# Patient Record
Sex: Female | Born: 1960
Health system: Southern US, Community
[De-identification: ages and names within clinical notes are randomized; demographics above are authoritative.]

## PROBLEM LIST (undated history)

## (undated) DIAGNOSIS — L409 Psoriasis, unspecified: Secondary | ICD-10-CM

## (undated) DIAGNOSIS — S52021A Displaced fracture of olecranon process without intraarticular extension of right ulna, initial encounter for closed fracture: Secondary | ICD-10-CM

## (undated) DIAGNOSIS — C801 Malignant (primary) neoplasm, unspecified: Secondary | ICD-10-CM

## (undated) DIAGNOSIS — T7840XA Allergy, unspecified, initial encounter: Secondary | ICD-10-CM

## (undated) HISTORY — DX: Allergy, unspecified, initial encounter: T78.40XA

## (undated) HISTORY — PX: POLYPECTOMY: SHX149

## (undated) HISTORY — DX: Malignant (primary) neoplasm, unspecified: C80.1

---

## 1999-07-16 ENCOUNTER — Other Ambulatory Visit: Admission: RE | Admit: 1999-07-16 | Discharge: 1999-07-16 | Payer: Self-pay | Admitting: Obstetrics and Gynecology

## 1999-10-25 ENCOUNTER — Encounter: Admission: RE | Admit: 1999-10-25 | Discharge: 1999-10-25 | Payer: Self-pay | Admitting: Obstetrics and Gynecology

## 1999-10-25 ENCOUNTER — Encounter: Payer: Self-pay | Admitting: Obstetrics and Gynecology

## 2000-09-09 ENCOUNTER — Other Ambulatory Visit: Admission: RE | Admit: 2000-09-09 | Discharge: 2000-09-09 | Payer: Self-pay | Admitting: Obstetrics and Gynecology

## 2001-01-01 ENCOUNTER — Encounter: Payer: Self-pay | Admitting: Obstetrics and Gynecology

## 2001-01-01 ENCOUNTER — Encounter: Admission: RE | Admit: 2001-01-01 | Discharge: 2001-01-01 | Payer: Self-pay | Admitting: Obstetrics and Gynecology

## 2001-09-29 ENCOUNTER — Other Ambulatory Visit: Admission: RE | Admit: 2001-09-29 | Discharge: 2001-09-29 | Payer: Self-pay | Admitting: Obstetrics and Gynecology

## 2002-01-05 ENCOUNTER — Encounter: Payer: Self-pay | Admitting: Obstetrics and Gynecology

## 2002-01-05 ENCOUNTER — Encounter: Admission: RE | Admit: 2002-01-05 | Discharge: 2002-01-05 | Payer: Self-pay | Admitting: Obstetrics and Gynecology

## 2002-10-26 ENCOUNTER — Other Ambulatory Visit: Admission: RE | Admit: 2002-10-26 | Discharge: 2002-10-26 | Payer: Self-pay | Admitting: Obstetrics and Gynecology

## 2002-11-17 ENCOUNTER — Encounter (INDEPENDENT_AMBULATORY_CARE_PROVIDER_SITE_OTHER): Payer: Self-pay

## 2002-11-17 ENCOUNTER — Ambulatory Visit (HOSPITAL_COMMUNITY): Admission: RE | Admit: 2002-11-17 | Discharge: 2002-11-17 | Payer: Self-pay | Admitting: Obstetrics and Gynecology

## 2003-03-15 ENCOUNTER — Encounter: Payer: Self-pay | Admitting: Obstetrics and Gynecology

## 2003-03-15 ENCOUNTER — Encounter: Admission: RE | Admit: 2003-03-15 | Discharge: 2003-03-15 | Payer: Self-pay | Admitting: Obstetrics and Gynecology

## 2003-12-05 ENCOUNTER — Other Ambulatory Visit: Admission: RE | Admit: 2003-12-05 | Discharge: 2003-12-05 | Payer: Self-pay | Admitting: Obstetrics and Gynecology

## 2004-03-26 ENCOUNTER — Encounter: Admission: RE | Admit: 2004-03-26 | Discharge: 2004-03-26 | Payer: Self-pay | Admitting: Obstetrics and Gynecology

## 2005-04-17 ENCOUNTER — Encounter: Admission: RE | Admit: 2005-04-17 | Discharge: 2005-04-17 | Payer: Self-pay | Admitting: Obstetrics and Gynecology

## 2005-12-16 HISTORY — PX: DILATION AND CURETTAGE OF UTERUS: SHX78

## 2006-04-18 ENCOUNTER — Encounter: Admission: RE | Admit: 2006-04-18 | Discharge: 2006-04-18 | Payer: Self-pay | Admitting: Obstetrics and Gynecology

## 2007-04-21 ENCOUNTER — Encounter: Admission: RE | Admit: 2007-04-21 | Discharge: 2007-04-21 | Payer: Self-pay | Admitting: Obstetrics and Gynecology

## 2007-04-24 ENCOUNTER — Encounter: Admission: RE | Admit: 2007-04-24 | Discharge: 2007-04-24 | Payer: Self-pay | Admitting: Obstetrics and Gynecology

## 2008-04-25 ENCOUNTER — Encounter: Admission: RE | Admit: 2008-04-25 | Discharge: 2008-04-25 | Payer: Self-pay | Admitting: Obstetrics and Gynecology

## 2009-04-26 ENCOUNTER — Encounter: Admission: RE | Admit: 2009-04-26 | Discharge: 2009-04-26 | Payer: Self-pay | Admitting: Obstetrics and Gynecology

## 2010-04-27 ENCOUNTER — Encounter: Admission: RE | Admit: 2010-04-27 | Discharge: 2010-04-27 | Payer: Self-pay | Admitting: Obstetrics and Gynecology

## 2010-05-01 ENCOUNTER — Encounter: Admission: RE | Admit: 2010-05-01 | Discharge: 2010-05-01 | Payer: Self-pay | Admitting: Obstetrics and Gynecology

## 2011-01-06 ENCOUNTER — Encounter: Payer: Self-pay | Admitting: Obstetrics and Gynecology

## 2011-03-15 ENCOUNTER — Ambulatory Visit (AMBULATORY_SURGERY_CENTER): Payer: Commercial Managed Care - PPO | Admitting: *Deleted

## 2011-03-15 ENCOUNTER — Encounter: Payer: Self-pay | Admitting: Internal Medicine

## 2011-03-15 VITALS — Ht 66.0 in | Wt 119.8 lb

## 2011-03-15 DIAGNOSIS — Z1211 Encounter for screening for malignant neoplasm of colon: Secondary | ICD-10-CM

## 2011-03-15 MED ORDER — PEG-KCL-NACL-NASULF-NA ASC-C 100 G PO SOLR: 1.0000 | Freq: Once | ORAL | Status: AC

## 2011-03-29 ENCOUNTER — Ambulatory Visit (AMBULATORY_SURGERY_CENTER): Payer: Commercial Managed Care - PPO | Admitting: Internal Medicine

## 2011-03-29 ENCOUNTER — Encounter: Payer: Self-pay | Admitting: Internal Medicine

## 2011-03-29 VITALS — BP 112/69 | HR 59 | Temp 98.2°F | Resp 15 | Ht 66.0 in | Wt 118.0 lb

## 2011-03-29 DIAGNOSIS — R933 Abnormal findings on diagnostic imaging of other parts of digestive tract: Secondary | ICD-10-CM

## 2011-03-29 DIAGNOSIS — D126 Benign neoplasm of colon, unspecified: Secondary | ICD-10-CM

## 2011-03-29 DIAGNOSIS — Z1211 Encounter for screening for malignant neoplasm of colon: Secondary | ICD-10-CM

## 2011-03-29 MED ORDER — SODIUM CHLORIDE 0.9 % IV SOLN: 500.0000 mL | INTRAVENOUS | Status: DC

## 2011-03-29 NOTE — Patient Instructions (Signed)
Please have another colonoscopy in 5 yrs due to the probable polyp in your intestine.   If you have any questions, please call us at [phone]. Thank-you.

## 2011-04-01 ENCOUNTER — Telehealth: Payer: Self-pay | Admitting: *Deleted

## 2011-04-01 NOTE — Telephone Encounter (Signed)
Left message on identified voice mail.

## 2011-05-03 NOTE — Op Note (Signed)
NAMEIMAGENE, Tiffany Lopez                         ACCOUNT NO.:  [id number]   MEDICAL RECORD NO.:  [id number]                   PATIENT TYPE:  AMB   LOCATION:  SDC                                  FACILITY:  [facility]   PHYSICIAN:  Sherry A. Rosalio Macadamia, M.D.           DATE OF BIRTH:  11/24/1961   DATE OF PROCEDURE:  11/17/2002  DATE OF DISCHARGE:  11/17/2002                                 OPERATIVE REPORT   PREOPERATIVE DIAGNOSES:  1. Menorrhagia.  2. Tight hymenal band.   POSTOPERATIVE DIAGNOSES:  1. Menorrhagia.  2. Tight hymenal band.  3. Endometrial polyp.   PROCEDURES:  Dilatation and curettage, hysteroscopy with resectoscope.   SURGEON:  Sherry A. Rosalio Macadamia, M.D.   ANESTHESIA:  MAC.   COMPLICATIONS:  None.   ESTIMATED BLOOD LOSS:  Less than 5 cc.   FLUIDS:  Sorbitol differential -20 cc.   INDICATIONS:  This is a 50 year old 2, P2-0-0-2, woman who has had severe  menorrhagia.  The patient had a ultrasound performed revealing a thickened  endometrium with probable endometrial polyp present.  The patient also has  had a very tight hymenal band causing some dyspareunia.  Because of this,  the patient is brought to the operating room for [facility] hysteroscopy with  resectoscope and hymenotomy.   FINDINGS:  Normal-sized, retroverted uterus.  Two endometrial polyps.  Tight  hymenal band.   DESCRIPTION OF PROCEDURE:  The patient was brought into the operating room  and given adequate IV sedation.  She was placed in the dorsal lithotomy  position.  Her perineum was washed with Hibiclens.  The patient was draped  in a sterile fashion.  The speculum was placed within the vagina.  The  vagina was washed with Hibiclens.  Paracervical block was administered with  1% Nesacaine.  Anterior lip of the cervix was grasped with a single-tooth  tenaculum.  Cervix was dilated with Pratt dilators to a #31.  Hysteroscope  was introduced into the endometrial cavity.  Pictures were obtained.  Using  double-loupe right angle resector, endometrial polyps were resected and  circumferential resections were taken throughout the endometrial cavity.  Adequate hemostasis was present.  The speculum was removed from the vagina.  The hymenal band was then evaluated.   The hymenal band was infiltrated with 0.5% Marcaine with epinephrine.  Using  Kelly clamps, the hymenal band was doubly clamped at 5 o'clock and 7  o'clock.  This was cut and sutured with 4-0 chromic.  This was done to an  adequate depth to be able to release the tension of the hymenal band.  Adequate hemostasis was present.  The patient was then taken out of the  dorsal lithotomy position.  She was awakened.  She was moved from the  operating table to a stretcher in stable condition.  Sherry A. Rosalio Macadamia, M.D.    SAD/MEDQ  D:  12/02/2002  T:  12/02/2002  Job:  [id number]

## 2011-06-10 ENCOUNTER — Other Ambulatory Visit: Payer: Self-pay | Admitting: Obstetrics & Gynecology

## 2011-06-10 DIAGNOSIS — Z1231 Encounter for screening mammogram for malignant neoplasm of breast: Secondary | ICD-10-CM

## 2011-07-02 ENCOUNTER — Ambulatory Visit: Payer: Commercial Managed Care - PPO

## 2011-08-06 ENCOUNTER — Ambulatory Visit
Admission: RE | Admit: 2011-08-06 | Discharge: 2011-08-06 | Disposition: A | Discharge status: Home / Self Care | Payer: Commercial Managed Care - PPO | Source: Ambulatory Visit | Attending: Obstetrics & Gynecology | Admitting: Obstetrics & Gynecology

## 2011-08-06 DIAGNOSIS — Z1231 Encounter for screening mammogram for malignant neoplasm of breast: Secondary | ICD-10-CM

## 2012-10-02 ENCOUNTER — Other Ambulatory Visit: Payer: Self-pay | Admitting: Obstetrics & Gynecology

## 2012-10-02 DIAGNOSIS — Z1231 Encounter for screening mammogram for malignant neoplasm of breast: Secondary | ICD-10-CM

## 2012-11-05 ENCOUNTER — Ambulatory Visit
Admission: RE | Admit: 2012-11-05 | Discharge: 2012-11-05 | Disposition: A | Discharge status: Home / Self Care | Payer: Commercial Managed Care - PPO | Source: Ambulatory Visit | Attending: Obstetrics & Gynecology | Admitting: Obstetrics & Gynecology

## 2012-11-05 DIAGNOSIS — Z1231 Encounter for screening mammogram for malignant neoplasm of breast: Secondary | ICD-10-CM

## 2013-03-03 ENCOUNTER — Other Ambulatory Visit: Payer: Self-pay | Admitting: Obstetrics and Gynecology

## 2013-03-03 DIAGNOSIS — Z780 Asymptomatic menopausal state: Secondary | ICD-10-CM

## 2013-03-22 ENCOUNTER — Other Ambulatory Visit: Payer: Commercial Managed Care - PPO

## 2013-04-08 ENCOUNTER — Ambulatory Visit
Admission: RE | Admit: 2013-04-08 | Discharge: 2013-04-08 | Disposition: A | Discharge status: Home / Self Care | Payer: Commercial Managed Care - PPO | Source: Ambulatory Visit | Attending: Obstetrics and Gynecology | Admitting: Obstetrics and Gynecology

## 2013-04-08 DIAGNOSIS — Z780 Asymptomatic menopausal state: Secondary | ICD-10-CM

## 2013-04-08 DIAGNOSIS — M899 Disorder of bone, unspecified: Secondary | ICD-10-CM

## 2014-02-15 ENCOUNTER — Other Ambulatory Visit: Payer: Self-pay

## 2014-02-15 DIAGNOSIS — Z1231 Encounter for screening mammogram for malignant neoplasm of breast: Secondary | ICD-10-CM

## 2014-03-15 ENCOUNTER — Ambulatory Visit
Admission: RE | Admit: 2014-03-15 | Discharge: 2014-03-15 | Disposition: A | Discharge status: Home / Self Care | Payer: 59 | Source: Ambulatory Visit

## 2014-03-15 DIAGNOSIS — Z1231 Encounter for screening mammogram for malignant neoplasm of breast: Secondary | ICD-10-CM

## 2015-06-28 ENCOUNTER — Other Ambulatory Visit: Payer: Self-pay

## 2015-06-28 DIAGNOSIS — Z1231 Encounter for screening mammogram for malignant neoplasm of breast: Secondary | ICD-10-CM

## 2015-07-04 ENCOUNTER — Ambulatory Visit: Payer: 59

## 2015-07-18 ENCOUNTER — Ambulatory Visit
Admission: RE | Admit: 2015-07-18 | Discharge: 2015-07-18 | Disposition: A | Discharge status: Home / Self Care | Payer: 59 | Source: Ambulatory Visit

## 2015-07-18 DIAGNOSIS — Z1231 Encounter for screening mammogram for malignant neoplasm of breast: Secondary | ICD-10-CM

## 2015-10-07 ENCOUNTER — Other Ambulatory Visit: Payer: Self-pay | Admitting: Obstetrics and Gynecology

## 2015-10-07 DIAGNOSIS — M899 Disorder of bone, unspecified: Secondary | ICD-10-CM

## 2015-10-07 DIAGNOSIS — M8580 Other specified disorders of bone density and structure, unspecified site: Secondary | ICD-10-CM

## 2015-10-07 DIAGNOSIS — M949 Disorder of cartilage, unspecified: Secondary | ICD-10-CM

## 2015-12-17 HISTORY — PX: COLONOSCOPY: SHX174

## 2016-03-08 ENCOUNTER — Encounter: Payer: Self-pay | Admitting: Gastroenterology

## 2016-03-14 DIAGNOSIS — Z0000 Encounter for general adult medical examination without abnormal findings: Secondary | ICD-10-CM | POA: Diagnosis not present

## 2016-03-22 DIAGNOSIS — Z1389 Encounter for screening for other disorder: Secondary | ICD-10-CM | POA: Diagnosis not present

## 2016-03-22 DIAGNOSIS — Z681 Body mass index (BMI) 19 or less, adult: Secondary | ICD-10-CM | POA: Diagnosis not present

## 2016-03-22 DIAGNOSIS — K635 Polyp of colon: Secondary | ICD-10-CM | POA: Diagnosis not present

## 2016-03-22 DIAGNOSIS — M722 Plantar fascial fibromatosis: Secondary | ICD-10-CM | POA: Diagnosis not present

## 2016-03-22 DIAGNOSIS — C439 Malignant melanoma of skin, unspecified: Secondary | ICD-10-CM | POA: Diagnosis not present

## 2016-03-22 DIAGNOSIS — E7800 Pure hypercholesterolemia, unspecified: Secondary | ICD-10-CM | POA: Diagnosis not present

## 2016-03-22 DIAGNOSIS — R3129 Other microscopic hematuria: Secondary | ICD-10-CM | POA: Diagnosis not present

## 2016-03-22 DIAGNOSIS — Z0000 Encounter for general adult medical examination without abnormal findings: Secondary | ICD-10-CM | POA: Diagnosis not present

## 2016-05-24 ENCOUNTER — Encounter: Payer: Self-pay | Admitting: Gastroenterology

## 2016-06-26 ENCOUNTER — Other Ambulatory Visit: Payer: Self-pay | Admitting: Obstetrics and Gynecology

## 2016-06-26 DIAGNOSIS — Z1231 Encounter for screening mammogram for malignant neoplasm of breast: Secondary | ICD-10-CM

## 2016-07-02 DIAGNOSIS — D0461 Carcinoma in situ of skin of right upper limb, including shoulder: Secondary | ICD-10-CM | POA: Diagnosis not present

## 2016-07-02 DIAGNOSIS — D2271 Melanocytic nevi of right lower limb, including hip: Secondary | ICD-10-CM | POA: Diagnosis not present

## 2016-07-02 DIAGNOSIS — D485 Neoplasm of uncertain behavior of skin: Secondary | ICD-10-CM | POA: Diagnosis not present

## 2016-07-02 DIAGNOSIS — D1801 Hemangioma of skin and subcutaneous tissue: Secondary | ICD-10-CM | POA: Diagnosis not present

## 2016-07-02 DIAGNOSIS — Z85828 Personal history of other malignant neoplasm of skin: Secondary | ICD-10-CM | POA: Diagnosis not present

## 2016-07-02 DIAGNOSIS — L812 Freckles: Secondary | ICD-10-CM | POA: Diagnosis not present

## 2016-07-02 DIAGNOSIS — L821 Other seborrheic keratosis: Secondary | ICD-10-CM | POA: Diagnosis not present

## 2016-07-26 ENCOUNTER — Ambulatory Visit (AMBULATORY_SURGERY_CENTER): Payer: Self-pay | Admitting: *Deleted

## 2016-07-26 VITALS — Ht 66.0 in | Wt 119.0 lb

## 2016-07-26 DIAGNOSIS — Z86010 Personal history of colonic polyps: Secondary | ICD-10-CM

## 2016-07-26 MED ORDER — NA SULFATE-K SULFATE-MG SULF 17.5-3.13-1.6 GM/177ML PO SOLN: 1.0000 | Freq: Once | ORAL | 0 refills | Status: AC

## 2016-07-26 NOTE — Progress Notes (Signed)
No allergies to eggs or soy. No problems with anesthesia.  Pt given Emmi instructions for colonoscopy  No oxygen use  No diet drug use  

## 2016-08-08 ENCOUNTER — Telehealth: Payer: Self-pay | Admitting: Gastroenterology

## 2016-08-08 MED ORDER — NA SULFATE-K SULFATE-MG SULF 17.5-3.13-1.6 GM/177ML PO SOLN: 1.0000 | Freq: Once | ORAL | 0 refills | Status: AC

## 2016-08-08 NOTE — Telephone Encounter (Signed)
rx for Suprep resent to patient's pharmacy.

## 2016-08-09 ENCOUNTER — Encounter: Payer: Self-pay | Admitting: Gastroenterology

## 2016-08-09 ENCOUNTER — Ambulatory Visit (AMBULATORY_SURGERY_CENTER): Payer: 59 | Admitting: Gastroenterology

## 2016-08-09 VITALS — BP 110/72 | HR 50 | Temp 97.4°F | Resp 21 | Ht 66.0 in | Wt 119.0 lb

## 2016-08-09 DIAGNOSIS — K5520 Angiodysplasia of colon without hemorrhage: Secondary | ICD-10-CM

## 2016-08-09 DIAGNOSIS — Z86010 Personal history of colonic polyps: Secondary | ICD-10-CM | POA: Diagnosis not present

## 2016-08-09 DIAGNOSIS — Z1211 Encounter for screening for malignant neoplasm of colon: Secondary | ICD-10-CM | POA: Diagnosis not present

## 2016-08-09 DIAGNOSIS — D128 Benign neoplasm of rectum: Secondary | ICD-10-CM | POA: Diagnosis not present

## 2016-08-09 DIAGNOSIS — D129 Benign neoplasm of anus and anal canal: Secondary | ICD-10-CM

## 2016-08-09 MED ORDER — SODIUM CHLORIDE 0.9 % IV SOLN: 500.0000 mL | INTRAVENOUS | Status: DC

## 2016-08-09 NOTE — Op Note (Signed)
[facility] Patient Name: Tiffany Lopez Procedure Date: 08/09/2016 7:59 AM MRN: [id number] Endoscopist: Remo Lipps P. Havery Moros , MD Age: 55 Referring MD:  Date of Birth: [birth year] Gender: Female Account #: [id number] Procedure:                Colonoscopy Indications:              High risk colon cancer surveillance: Personal                            history of colonic polyps Medicines:                Monitored Anesthesia Care Procedure:                Pre-Anesthesia Assessment:                           - Prior to the procedure, a History and Physical                            was performed, and patient medications and                            allergies were reviewed. The patient's tolerance of                            previous anesthesia was also reviewed. The risks                            and benefits of the procedure and the sedation                            options and risks were discussed with the patient.                            All questions were answered, and informed consent                            was obtained. Prior Anticoagulants: The patient has                            taken no previous anticoagulant or antiplatelet                            agents. ASA Grade Assessment: II - A patient with                            mild systemic disease. After reviewing the risks                            and benefits, the patient was deemed in                            satisfactory condition to undergo the procedure.  After obtaining informed consent, the colonoscope                            was passed under direct vision. Throughout the                            procedure, the patient's blood pressure, pulse, and                            oxygen saturations were monitored continuously. The                            Model PCF-H190DL [id number]) scope was introduced                            through the anus and advanced  to the the cecum,                            identified by appendiceal orifice and ileocecal                            valve. The colonoscopy was performed without                            difficulty. The patient tolerated the procedure                            well. The quality of the bowel preparation was                            good. The ileocecal valve, appendiceal orifice, and                            rectum were photographed. Scope In: 8:01:54 AM Scope Out: 8:18:50 AM Scope Withdrawal Time: 0 hours 12 minutes 34 seconds  Total Procedure Duration: 0 hours 16 minutes 56 seconds  Findings:                 The perianal and digital rectal examinations were                            normal.                           A 5 mm polyp was found in the rectum. The polyp was                            sessile. The polyp was removed with a cold snare.                            Resection and retrieval were complete.                           The colon was tortuous.  A single small angiodysplastic lesion was found in                            the sigmoid colon.                           The exam was otherwise without abnormality on                            direct and retroflexion views. Complications:            No immediate complications. Estimated blood loss:                            Minimal. Estimated Blood Loss:     Estimated blood loss was minimal. Impression:               - One 5 mm polyp in the rectum, removed with a cold                            snare. Resected and retrieved.                           - Tortuous colon.                           - A single colonic angiodysplastic lesion.                           - The examination was otherwise normal on direct                            and retroflexion views. Recommendation:           - Patient has a contact number available for                            emergencies. The signs and symptoms of  potential                            delayed complications were discussed with the                            patient. Return to normal activities tomorrow.                            Written discharge instructions were provided to the                            patient.                           - Resume previous diet.                           - Continue present medications.                           -  No aspirin, ibuprofen, naproxen, or other                            non-steroidal anti-inflammatory drugs for 2 weeks                            after polyp removal.                           - Await pathology results.                           - Repeat colonoscopy is recommended for                            surveillance. The colonoscopy date will be                            determined after pathology results from today's                            exam become available for review. Remo Lipps P. Dorethy Tomey, MD 08/09/2016 8:23:09 AM This report has been signed electronically.

## 2016-08-09 NOTE — Patient Instructions (Signed)
YOU HAD AN ENDOSCOPIC PROCEDURE TODAY AT THE [facility]:   Refer to the procedure report that was given to you for any specific questions about what was found during the examination.  If the procedure report does not answer your questions, please call your gastroenterologist to clarify.  If you requested that your care partner not be given the details of your procedure findings, then the procedure report has been included in a sealed envelope for you to review at your convenience later.  YOU SHOULD EXPECT: Some feelings of bloating in the abdomen. Passage of more gas than usual.  Walking can help get rid of the air that was put into your GI tract during the procedure and reduce the bloating. If you had a lower endoscopy (such as a colonoscopy or flexible sigmoidoscopy) you may notice spotting of blood in your stool or on the toilet paper. If you underwent a bowel prep for your procedure, you may not have a normal bowel movement for a few days.  Please Note:  You might notice some irritation and congestion in your nose or some drainage.  This is from the oxygen used during your procedure.  There is no need for concern and it should clear up in a day or so.  SYMPTOMS TO REPORT IMMEDIATELY:   Following lower endoscopy (colonoscopy or flexible sigmoidoscopy):  Excessive amounts of blood in the stool  Significant tenderness or worsening of abdominal pains  Swelling of the abdomen that is new, acute  Fever of 100F or higher   For urgent or emergent issues, a gastroenterologist can be reached at any hour by calling [phone].   DIET:  We do recommend a small meal at first, but then you may proceed to your regular diet.  Drink plenty of fluids but you should avoid alcoholic beverages for 24 hours.  ACTIVITY:  You should plan to take it easy for the rest of today and you should NOT DRIVE or use heavy machinery until tomorrow (because of the sedation medicines used during the test).     FOLLOW UP: Our staff will call the number listed on your records the next business day following your procedure to check on you and address any questions or concerns that you may have regarding the information given to you following your procedure. If we do not reach you, we will leave a message.  However, if you are feeling well and you are not experiencing any problems, there is no need to return our call.  We will assume that you have returned to your regular daily activities without incident.  If any biopsies were taken you will be contacted by phone or by letter within the next 1-3 weeks.  Please call us at [phone] if you have not heard about the biopsies in 3 weeks.    SIGNATURES/CONFIDENTIALITY: You and/or your care partner have signed paperwork which will be entered into your electronic medical record.  These signatures attest to the fact that that the information above on your After Visit Summary has been reviewed and is understood.  Full responsibility of the confidentiality of this discharge information lies with you and/or your care-partner.  Read all of the handouts given to you by your recovery room nurse. Thank-you for choosing us for your healthcare needs today. 

## 2016-08-09 NOTE — Progress Notes (Signed)
Called to room to assist during endoscopic procedure.  Patient ID and intended procedure confirmed with present staff. Received instructions for my participation in the procedure from the performing physician.  

## 2016-08-09 NOTE — Progress Notes (Signed)
A and O x3. Report to RN. Tolerated MAC anesthesia well. 

## 2016-08-12 ENCOUNTER — Telehealth: Payer: Self-pay

## 2016-08-12 NOTE — Telephone Encounter (Signed)
  Follow up Call-  Call back number 08/09/2016  Post procedure Call Back phone  # [phone]  Permission to leave phone message Yes  Some recent data might be hidden    Patient was called for follow up after her procedure on 08/09/2016. No answer at the number given for follow up phone call. A message was left on the answering machine.

## 2016-08-14 ENCOUNTER — Encounter: Payer: Self-pay | Admitting: Gastroenterology

## 2016-08-20 ENCOUNTER — Ambulatory Visit
Admission: RE | Admit: 2016-08-20 | Discharge: 2016-08-20 | Disposition: A | Discharge status: Home / Self Care | Payer: 59 | Source: Ambulatory Visit | Attending: Obstetrics and Gynecology | Admitting: Obstetrics and Gynecology

## 2016-08-20 DIAGNOSIS — Z1231 Encounter for screening mammogram for malignant neoplasm of breast: Secondary | ICD-10-CM | POA: Diagnosis not present

## 2016-08-21 ENCOUNTER — Other Ambulatory Visit: Payer: Self-pay | Admitting: Obstetrics and Gynecology

## 2016-08-21 DIAGNOSIS — R928 Other abnormal and inconclusive findings on diagnostic imaging of breast: Secondary | ICD-10-CM

## 2016-08-27 ENCOUNTER — Ambulatory Visit
Admission: RE | Admit: 2016-08-27 | Discharge: 2016-08-27 | Disposition: A | Discharge status: Home / Self Care | Payer: 59 | Source: Ambulatory Visit | Attending: Obstetrics and Gynecology | Admitting: Obstetrics and Gynecology

## 2016-08-27 DIAGNOSIS — N6489 Other specified disorders of breast: Secondary | ICD-10-CM | POA: Diagnosis not present

## 2016-08-27 DIAGNOSIS — R928 Other abnormal and inconclusive findings on diagnostic imaging of breast: Secondary | ICD-10-CM

## 2016-08-27 DIAGNOSIS — R922 Inconclusive mammogram: Secondary | ICD-10-CM | POA: Diagnosis not present

## 2016-09-24 DIAGNOSIS — Z01419 Encounter for gynecological examination (general) (routine) without abnormal findings: Secondary | ICD-10-CM | POA: Diagnosis not present

## 2016-09-24 DIAGNOSIS — Z681 Body mass index (BMI) 19 or less, adult: Secondary | ICD-10-CM | POA: Diagnosis not present

## 2016-09-24 DIAGNOSIS — M899 Disorder of bone, unspecified: Secondary | ICD-10-CM | POA: Diagnosis not present

## 2016-12-24 DIAGNOSIS — D2271 Melanocytic nevi of right lower limb, including hip: Secondary | ICD-10-CM | POA: Diagnosis not present

## 2016-12-24 DIAGNOSIS — D225 Melanocytic nevi of trunk: Secondary | ICD-10-CM | POA: Diagnosis not present

## 2016-12-24 DIAGNOSIS — L821 Other seborrheic keratosis: Secondary | ICD-10-CM | POA: Diagnosis not present

## 2016-12-24 DIAGNOSIS — Z85820 Personal history of malignant melanoma of skin: Secondary | ICD-10-CM | POA: Diagnosis not present

## 2016-12-24 DIAGNOSIS — D2272 Melanocytic nevi of left lower limb, including hip: Secondary | ICD-10-CM | POA: Diagnosis not present

## 2016-12-24 DIAGNOSIS — D1801 Hemangioma of skin and subcutaneous tissue: Secondary | ICD-10-CM | POA: Diagnosis not present

## 2016-12-24 DIAGNOSIS — C44619 Basal cell carcinoma of skin of left upper limb, including shoulder: Secondary | ICD-10-CM | POA: Diagnosis not present

## 2016-12-24 DIAGNOSIS — L812 Freckles: Secondary | ICD-10-CM | POA: Diagnosis not present

## 2016-12-24 DIAGNOSIS — Z85828 Personal history of other malignant neoplasm of skin: Secondary | ICD-10-CM | POA: Diagnosis not present

## 2016-12-24 DIAGNOSIS — D485 Neoplasm of uncertain behavior of skin: Secondary | ICD-10-CM | POA: Diagnosis not present

## 2017-03-31 ENCOUNTER — Other Ambulatory Visit (HOSPITAL_COMMUNITY): Payer: Self-pay | Admitting: Adult Health

## 2017-03-31 MED ORDER — LEVOFLOXACIN 750 MG PO TABS: 750.0000 mg | ORAL_TABLET | Freq: Every day | ORAL | Status: DC

## 2017-04-02 ENCOUNTER — Ambulatory Visit
Admission: RE | Admit: 2017-04-02 | Discharge: 2017-04-02 | Disposition: A | Discharge status: Home / Self Care | Payer: 59 | Source: Ambulatory Visit | Attending: Surgery | Admitting: Surgery

## 2017-04-02 ENCOUNTER — Other Ambulatory Visit: Payer: Self-pay | Admitting: *Deleted

## 2017-04-02 DIAGNOSIS — J1000 Influenza due to other identified influenza virus with unspecified type of pneumonia: Secondary | ICD-10-CM

## 2017-04-02 DIAGNOSIS — R05 Cough: Secondary | ICD-10-CM | POA: Diagnosis not present

## 2017-04-02 DIAGNOSIS — R079 Chest pain, unspecified: Secondary | ICD-10-CM | POA: Diagnosis not present

## 2017-06-17 DIAGNOSIS — Z0000 Encounter for general adult medical examination without abnormal findings: Secondary | ICD-10-CM | POA: Diagnosis not present

## 2017-06-24 DIAGNOSIS — E7800 Pure hypercholesterolemia, unspecified: Secondary | ICD-10-CM | POA: Diagnosis not present

## 2017-06-24 DIAGNOSIS — K635 Polyp of colon: Secondary | ICD-10-CM | POA: Diagnosis not present

## 2017-06-24 DIAGNOSIS — Z681 Body mass index (BMI) 19 or less, adult: Secondary | ICD-10-CM | POA: Diagnosis not present

## 2017-06-24 DIAGNOSIS — Z1389 Encounter for screening for other disorder: Secondary | ICD-10-CM | POA: Diagnosis not present

## 2017-06-24 DIAGNOSIS — Z0000 Encounter for general adult medical examination without abnormal findings: Secondary | ICD-10-CM | POA: Diagnosis not present

## 2017-06-24 DIAGNOSIS — C439 Malignant melanoma of skin, unspecified: Secondary | ICD-10-CM | POA: Diagnosis not present

## 2017-07-01 DIAGNOSIS — D2272 Melanocytic nevi of left lower limb, including hip: Secondary | ICD-10-CM | POA: Diagnosis not present

## 2017-07-01 DIAGNOSIS — L821 Other seborrheic keratosis: Secondary | ICD-10-CM | POA: Diagnosis not present

## 2017-07-01 DIAGNOSIS — D1801 Hemangioma of skin and subcutaneous tissue: Secondary | ICD-10-CM | POA: Diagnosis not present

## 2017-07-01 DIAGNOSIS — Z85820 Personal history of malignant melanoma of skin: Secondary | ICD-10-CM | POA: Diagnosis not present

## 2017-07-01 DIAGNOSIS — D2271 Melanocytic nevi of right lower limb, including hip: Secondary | ICD-10-CM | POA: Diagnosis not present

## 2017-07-01 DIAGNOSIS — L814 Other melanin hyperpigmentation: Secondary | ICD-10-CM | POA: Diagnosis not present

## 2017-07-01 DIAGNOSIS — Z85828 Personal history of other malignant neoplasm of skin: Secondary | ICD-10-CM | POA: Diagnosis not present

## 2017-07-01 DIAGNOSIS — L812 Freckles: Secondary | ICD-10-CM | POA: Diagnosis not present

## 2017-10-22 ENCOUNTER — Other Ambulatory Visit: Payer: Self-pay | Admitting: Obstetrics and Gynecology

## 2017-10-22 DIAGNOSIS — Z1231 Encounter for screening mammogram for malignant neoplasm of breast: Secondary | ICD-10-CM

## 2017-11-27 DIAGNOSIS — Z01419 Encounter for gynecological examination (general) (routine) without abnormal findings: Secondary | ICD-10-CM | POA: Diagnosis not present

## 2017-11-27 DIAGNOSIS — Z1151 Encounter for screening for human papillomavirus (HPV): Secondary | ICD-10-CM | POA: Diagnosis not present

## 2017-11-27 DIAGNOSIS — Z6820 Body mass index (BMI) 20.0-20.9, adult: Secondary | ICD-10-CM | POA: Diagnosis not present

## 2017-11-28 ENCOUNTER — Ambulatory Visit
Admission: RE | Admit: 2017-11-28 | Discharge: 2017-11-28 | Disposition: A | Discharge status: Home / Self Care | Payer: 59 | Source: Ambulatory Visit | Attending: Obstetrics and Gynecology | Admitting: Obstetrics and Gynecology

## 2017-11-28 DIAGNOSIS — Z1231 Encounter for screening mammogram for malignant neoplasm of breast: Secondary | ICD-10-CM | POA: Diagnosis not present

## 2017-12-01 ENCOUNTER — Other Ambulatory Visit: Payer: Self-pay | Admitting: Obstetrics and Gynecology

## 2017-12-01 DIAGNOSIS — Z780 Asymptomatic menopausal state: Secondary | ICD-10-CM

## 2017-12-05 DIAGNOSIS — H524 Presbyopia: Secondary | ICD-10-CM | POA: Diagnosis not present

## 2017-12-26 ENCOUNTER — Ambulatory Visit
Admission: RE | Admit: 2017-12-26 | Discharge: 2017-12-26 | Disposition: A | Discharge status: Home / Self Care | Payer: 59 | Source: Ambulatory Visit | Attending: Obstetrics and Gynecology | Admitting: Obstetrics and Gynecology

## 2017-12-26 DIAGNOSIS — Z780 Asymptomatic menopausal state: Secondary | ICD-10-CM

## 2017-12-26 DIAGNOSIS — M810 Age-related osteoporosis without current pathological fracture: Secondary | ICD-10-CM | POA: Diagnosis not present

## 2018-01-01 DIAGNOSIS — D2272 Melanocytic nevi of left lower limb, including hip: Secondary | ICD-10-CM | POA: Diagnosis not present

## 2018-01-01 DIAGNOSIS — Z85828 Personal history of other malignant neoplasm of skin: Secondary | ICD-10-CM | POA: Diagnosis not present

## 2018-01-01 DIAGNOSIS — Z85820 Personal history of malignant melanoma of skin: Secondary | ICD-10-CM | POA: Diagnosis not present

## 2018-01-01 DIAGNOSIS — D1801 Hemangioma of skin and subcutaneous tissue: Secondary | ICD-10-CM | POA: Diagnosis not present

## 2018-01-01 DIAGNOSIS — L812 Freckles: Secondary | ICD-10-CM | POA: Diagnosis not present

## 2018-01-01 DIAGNOSIS — D485 Neoplasm of uncertain behavior of skin: Secondary | ICD-10-CM | POA: Diagnosis not present

## 2018-01-01 DIAGNOSIS — C44612 Basal cell carcinoma of skin of right upper limb, including shoulder: Secondary | ICD-10-CM | POA: Diagnosis not present

## 2018-01-01 DIAGNOSIS — L821 Other seborrheic keratosis: Secondary | ICD-10-CM | POA: Diagnosis not present

## 2018-01-01 DIAGNOSIS — D2271 Melanocytic nevi of right lower limb, including hip: Secondary | ICD-10-CM | POA: Diagnosis not present

## 2018-01-01 DIAGNOSIS — D225 Melanocytic nevi of trunk: Secondary | ICD-10-CM | POA: Diagnosis not present

## 2018-02-19 DIAGNOSIS — M810 Age-related osteoporosis without current pathological fracture: Secondary | ICD-10-CM | POA: Diagnosis not present

## 2018-06-26 DIAGNOSIS — R82998 Other abnormal findings in urine: Secondary | ICD-10-CM | POA: Diagnosis not present

## 2018-06-26 DIAGNOSIS — Z0000 Encounter for general adult medical examination without abnormal findings: Secondary | ICD-10-CM | POA: Diagnosis not present

## 2018-07-02 DIAGNOSIS — M722 Plantar fascial fibromatosis: Secondary | ICD-10-CM | POA: Diagnosis not present

## 2018-07-02 DIAGNOSIS — R3129 Other microscopic hematuria: Secondary | ICD-10-CM | POA: Diagnosis not present

## 2018-07-02 DIAGNOSIS — Z681 Body mass index (BMI) 19 or less, adult: Secondary | ICD-10-CM | POA: Diagnosis not present

## 2018-07-02 DIAGNOSIS — Z1389 Encounter for screening for other disorder: Secondary | ICD-10-CM | POA: Diagnosis not present

## 2018-07-02 DIAGNOSIS — C439 Malignant melanoma of skin, unspecified: Secondary | ICD-10-CM | POA: Diagnosis not present

## 2018-07-02 DIAGNOSIS — K635 Polyp of colon: Secondary | ICD-10-CM | POA: Diagnosis not present

## 2018-07-02 DIAGNOSIS — Z0000 Encounter for general adult medical examination without abnormal findings: Secondary | ICD-10-CM | POA: Diagnosis not present

## 2018-07-02 DIAGNOSIS — E7800 Pure hypercholesterolemia, unspecified: Secondary | ICD-10-CM | POA: Diagnosis not present

## 2018-07-10 DIAGNOSIS — D225 Melanocytic nevi of trunk: Secondary | ICD-10-CM | POA: Diagnosis not present

## 2018-07-10 DIAGNOSIS — D2272 Melanocytic nevi of left lower limb, including hip: Secondary | ICD-10-CM | POA: Diagnosis not present

## 2018-07-10 DIAGNOSIS — D2271 Melanocytic nevi of right lower limb, including hip: Secondary | ICD-10-CM | POA: Diagnosis not present

## 2018-07-10 DIAGNOSIS — L821 Other seborrheic keratosis: Secondary | ICD-10-CM | POA: Diagnosis not present

## 2018-07-10 DIAGNOSIS — Z85828 Personal history of other malignant neoplasm of skin: Secondary | ICD-10-CM | POA: Diagnosis not present

## 2018-07-10 DIAGNOSIS — L814 Other melanin hyperpigmentation: Secondary | ICD-10-CM | POA: Diagnosis not present

## 2018-07-10 DIAGNOSIS — B078 Other viral warts: Secondary | ICD-10-CM | POA: Diagnosis not present

## 2018-07-10 DIAGNOSIS — D1801 Hemangioma of skin and subcutaneous tissue: Secondary | ICD-10-CM | POA: Diagnosis not present

## 2018-10-06 DIAGNOSIS — H11441 Conjunctival cysts, right eye: Secondary | ICD-10-CM | POA: Diagnosis not present

## 2018-12-03 ENCOUNTER — Other Ambulatory Visit: Payer: Self-pay | Admitting: Obstetrics and Gynecology

## 2018-12-03 DIAGNOSIS — Z1231 Encounter for screening mammogram for malignant neoplasm of breast: Secondary | ICD-10-CM

## 2018-12-10 ENCOUNTER — Ambulatory Visit (INDEPENDENT_AMBULATORY_CARE_PROVIDER_SITE_OTHER): Payer: 59

## 2018-12-10 ENCOUNTER — Encounter (INDEPENDENT_AMBULATORY_CARE_PROVIDER_SITE_OTHER): Payer: Self-pay | Admitting: Orthopaedic Surgery

## 2018-12-10 ENCOUNTER — Ambulatory Visit (INDEPENDENT_AMBULATORY_CARE_PROVIDER_SITE_OTHER): Payer: 59 | Admitting: Orthopaedic Surgery

## 2018-12-10 DIAGNOSIS — M722 Plantar fascial fibromatosis: Secondary | ICD-10-CM | POA: Diagnosis not present

## 2018-12-10 NOTE — Progress Notes (Signed)
Office Visit Note   Patient: Tiffany Lopez           Date of Birth: [birth year]           MRN: [id number] Visit Date: 12/10/2018              Requested by: Haywood Pao, MD [address] [place], [place] [address] PCP: Haywood Pao, MD   Assessment & Plan: Visit Diagnoses:  1. Plantar fasciitis of left foot     Plan: Impression is left plantar fasciitis.  We discussed at length home exercises and stretches.  We also discussed cortisone injection but she declined this today.  I did give her a prescription for physical therapy.  Continue to wear the night splints.  I recommend plantar fascial orthotics at [facility].  Questions encouraged and answered.  Follow-up as needed.  Follow-Up Instructions: Return if symptoms worsen or fail to improve.   Orders:  Orders Placed This Encounter  Procedures  . XR Os Calcis Left   No orders of the defined types were placed in this encounter.     Procedures: No procedures performed   Clinical Data: No additional findings.   Subjective: Chief Complaint  Patient presents with  . Left Foot - Pain    Diette is a 57 year old female comes in with approximately 6 months of left heel pain.  She does run regularly approximately 3 miles a day.  She states that she has central left heel pain that is worse with the first few steps after prolonged immobilization.  It is worse in the morning.  She denies any radiating pain or posterior Achilles or heel pain.  Denies any numbness and tingling.  She just ordered a night splint and she has been using a ball to stretch her plantar fascia.  She denies any injuries.   Review of Systems  Constitutional: Negative.   HENT: Negative.   Eyes: Negative.   Respiratory: Negative.   Cardiovascular: Negative.   Endocrine: Negative.   Musculoskeletal: Negative.   Neurological: Negative.   Hematological: Negative.   Psychiatric/Behavioral: Negative.   All other systems reviewed and are  negative.    Objective: Vital Signs: There were no vitals taken for this visit.  Physical Exam Vitals signs and nursing note reviewed.  Constitutional:      Appearance: She is well-developed.  HENT:     Head: Normocephalic and atraumatic.  Neck:     Musculoskeletal: Neck supple.  Pulmonary:     Effort: Pulmonary effort is normal.  Abdominal:     Palpations: Abdomen is soft.  Skin:    General: Skin is warm.     Capillary Refill: Capillary refill takes less than 2 seconds.  Neurological:     Mental Status: She is alert and oriented to person, place, and time.  Psychiatric:        Behavior: Behavior normal.        Thought Content: Thought content normal.        Judgment: Judgment normal.     Ortho Exam Left heel exam shows tightness of the gastroc with positive Silverskiold test.  She is tender to palpation centrally underneath her heel.  Calcaneal compression is negative.  Tinel's is negative at tarsal tunnel.  Achilles insertion is nontender. Specialty Comments:  No specialty comments available.  Imaging: No results found.   PMFS History: There are no active problems to display for this patient.  Past Medical History:  Diagnosis Date  . Allergy   .  Cancer ([place])    melanoma skin cancer    Family History  Problem Relation Age of Onset  . Colon cancer Paternal Grandmother   . Esophageal cancer Neg Hx   . Stomach cancer Neg Hx   . Rectal cancer Neg Hx     Past Surgical History:  Procedure Laterality Date  . DILATION AND CURETTAGE OF UTERUS  2007   Social History   Occupational History  . Not on file  Tobacco Use  . Smoking status: Never Smoker  . Smokeless tobacco: Never Used  Substance and Sexual Activity  . Alcohol use: Yes    Comment: rare  . Drug use: No  . Sexual activity: Not on file

## 2019-01-07 ENCOUNTER — Ambulatory Visit
Admission: RE | Admit: 2019-01-07 | Discharge: 2019-01-07 | Disposition: A | Discharge status: Home / Self Care | Payer: 59 | Source: Ambulatory Visit | Attending: Obstetrics and Gynecology | Admitting: Obstetrics and Gynecology

## 2019-01-07 DIAGNOSIS — Z1231 Encounter for screening mammogram for malignant neoplasm of breast: Secondary | ICD-10-CM

## 2019-01-08 DIAGNOSIS — D225 Melanocytic nevi of trunk: Secondary | ICD-10-CM | POA: Diagnosis not present

## 2019-01-08 DIAGNOSIS — D1801 Hemangioma of skin and subcutaneous tissue: Secondary | ICD-10-CM | POA: Diagnosis not present

## 2019-01-08 DIAGNOSIS — D2272 Melanocytic nevi of left lower limb, including hip: Secondary | ICD-10-CM | POA: Diagnosis not present

## 2019-01-08 DIAGNOSIS — D2261 Melanocytic nevi of right upper limb, including shoulder: Secondary | ICD-10-CM | POA: Diagnosis not present

## 2019-01-08 DIAGNOSIS — L812 Freckles: Secondary | ICD-10-CM | POA: Diagnosis not present

## 2019-01-08 DIAGNOSIS — Z85828 Personal history of other malignant neoplasm of skin: Secondary | ICD-10-CM | POA: Diagnosis not present

## 2019-01-08 DIAGNOSIS — L821 Other seborrheic keratosis: Secondary | ICD-10-CM | POA: Diagnosis not present

## 2019-01-15 DIAGNOSIS — Z01419 Encounter for gynecological examination (general) (routine) without abnormal findings: Secondary | ICD-10-CM | POA: Diagnosis not present

## 2019-01-15 DIAGNOSIS — Z681 Body mass index (BMI) 19 or less, adult: Secondary | ICD-10-CM | POA: Diagnosis not present

## 2019-07-01 DIAGNOSIS — Z0000 Encounter for general adult medical examination without abnormal findings: Secondary | ICD-10-CM | POA: Diagnosis not present

## 2019-07-06 DIAGNOSIS — E7800 Pure hypercholesterolemia, unspecified: Secondary | ICD-10-CM | POA: Diagnosis not present

## 2019-07-06 DIAGNOSIS — Z1339 Encounter for screening examination for other mental health and behavioral disorders: Secondary | ICD-10-CM | POA: Diagnosis not present

## 2019-07-06 DIAGNOSIS — Z0000 Encounter for general adult medical examination without abnormal findings: Secondary | ICD-10-CM | POA: Diagnosis not present

## 2019-07-06 DIAGNOSIS — C439 Malignant melanoma of skin, unspecified: Secondary | ICD-10-CM | POA: Diagnosis not present

## 2019-07-06 DIAGNOSIS — K635 Polyp of colon: Secondary | ICD-10-CM | POA: Diagnosis not present

## 2019-07-06 DIAGNOSIS — M810 Age-related osteoporosis without current pathological fracture: Secondary | ICD-10-CM | POA: Diagnosis not present

## 2019-07-06 DIAGNOSIS — Z1331 Encounter for screening for depression: Secondary | ICD-10-CM | POA: Diagnosis not present

## 2019-07-07 DIAGNOSIS — D2261 Melanocytic nevi of right upper limb, including shoulder: Secondary | ICD-10-CM | POA: Diagnosis not present

## 2019-07-07 DIAGNOSIS — D2262 Melanocytic nevi of left upper limb, including shoulder: Secondary | ICD-10-CM | POA: Diagnosis not present

## 2019-07-07 DIAGNOSIS — L812 Freckles: Secondary | ICD-10-CM | POA: Diagnosis not present

## 2019-07-07 DIAGNOSIS — D1801 Hemangioma of skin and subcutaneous tissue: Secondary | ICD-10-CM | POA: Diagnosis not present

## 2019-07-07 DIAGNOSIS — D225 Melanocytic nevi of trunk: Secondary | ICD-10-CM | POA: Diagnosis not present

## 2019-07-07 DIAGNOSIS — Z85820 Personal history of malignant melanoma of skin: Secondary | ICD-10-CM | POA: Diagnosis not present

## 2019-07-07 DIAGNOSIS — Z85828 Personal history of other malignant neoplasm of skin: Secondary | ICD-10-CM | POA: Diagnosis not present

## 2019-07-07 DIAGNOSIS — L821 Other seborrheic keratosis: Secondary | ICD-10-CM | POA: Diagnosis not present

## 2019-08-17 DIAGNOSIS — Z85828 Personal history of other malignant neoplasm of skin: Secondary | ICD-10-CM | POA: Diagnosis not present

## 2019-08-17 DIAGNOSIS — L720 Epidermal cyst: Secondary | ICD-10-CM | POA: Diagnosis not present

## 2019-08-17 DIAGNOSIS — L821 Other seborrheic keratosis: Secondary | ICD-10-CM | POA: Diagnosis not present

## 2020-01-11 DIAGNOSIS — Z85828 Personal history of other malignant neoplasm of skin: Secondary | ICD-10-CM | POA: Diagnosis not present

## 2020-01-11 DIAGNOSIS — L812 Freckles: Secondary | ICD-10-CM | POA: Diagnosis not present

## 2020-01-11 DIAGNOSIS — L821 Other seborrheic keratosis: Secondary | ICD-10-CM | POA: Diagnosis not present

## 2020-01-11 DIAGNOSIS — D225 Melanocytic nevi of trunk: Secondary | ICD-10-CM | POA: Diagnosis not present

## 2020-01-11 DIAGNOSIS — L308 Other specified dermatitis: Secondary | ICD-10-CM | POA: Diagnosis not present

## 2020-01-11 DIAGNOSIS — Z85820 Personal history of malignant melanoma of skin: Secondary | ICD-10-CM | POA: Diagnosis not present

## 2020-01-11 DIAGNOSIS — D1801 Hemangioma of skin and subcutaneous tissue: Secondary | ICD-10-CM | POA: Diagnosis not present

## 2020-03-29 ENCOUNTER — Other Ambulatory Visit: Payer: Self-pay | Admitting: Obstetrics and Gynecology

## 2020-03-29 DIAGNOSIS — Z1231 Encounter for screening mammogram for malignant neoplasm of breast: Secondary | ICD-10-CM

## 2020-04-07 ENCOUNTER — Other Ambulatory Visit: Payer: Self-pay

## 2020-04-07 ENCOUNTER — Ambulatory Visit
Admission: RE | Admit: 2020-04-07 | Discharge: 2020-04-07 | Disposition: A | Discharge status: Home / Self Care | Payer: 59 | Source: Ambulatory Visit

## 2020-04-07 DIAGNOSIS — Z1231 Encounter for screening mammogram for malignant neoplasm of breast: Secondary | ICD-10-CM

## 2020-04-21 DIAGNOSIS — H524 Presbyopia: Secondary | ICD-10-CM | POA: Diagnosis not present

## 2020-04-21 DIAGNOSIS — Z01419 Encounter for gynecological examination (general) (routine) without abnormal findings: Secondary | ICD-10-CM | POA: Diagnosis not present

## 2020-04-21 DIAGNOSIS — Z6820 Body mass index (BMI) 20.0-20.9, adult: Secondary | ICD-10-CM | POA: Diagnosis not present

## 2020-04-21 DIAGNOSIS — Z1151 Encounter for screening for human papillomavirus (HPV): Secondary | ICD-10-CM | POA: Diagnosis not present

## 2020-05-02 ENCOUNTER — Other Ambulatory Visit: Payer: Self-pay | Admitting: Obstetrics and Gynecology

## 2020-05-02 DIAGNOSIS — M810 Age-related osteoporosis without current pathological fracture: Secondary | ICD-10-CM

## 2020-07-25 DIAGNOSIS — Z85820 Personal history of malignant melanoma of skin: Secondary | ICD-10-CM | POA: Diagnosis not present

## 2020-07-25 DIAGNOSIS — L821 Other seborrheic keratosis: Secondary | ICD-10-CM | POA: Diagnosis not present

## 2020-07-25 DIAGNOSIS — D2272 Melanocytic nevi of left lower limb, including hip: Secondary | ICD-10-CM | POA: Diagnosis not present

## 2020-07-25 DIAGNOSIS — L810 Postinflammatory hyperpigmentation: Secondary | ICD-10-CM | POA: Diagnosis not present

## 2020-07-25 DIAGNOSIS — Z85828 Personal history of other malignant neoplasm of skin: Secondary | ICD-10-CM | POA: Diagnosis not present

## 2020-07-25 DIAGNOSIS — D225 Melanocytic nevi of trunk: Secondary | ICD-10-CM | POA: Diagnosis not present

## 2020-07-25 DIAGNOSIS — L812 Freckles: Secondary | ICD-10-CM | POA: Diagnosis not present

## 2020-07-25 DIAGNOSIS — D1801 Hemangioma of skin and subcutaneous tissue: Secondary | ICD-10-CM | POA: Diagnosis not present

## 2020-11-24 DIAGNOSIS — Z0000 Encounter for general adult medical examination without abnormal findings: Secondary | ICD-10-CM | POA: Diagnosis not present

## 2020-11-24 DIAGNOSIS — E7800 Pure hypercholesterolemia, unspecified: Secondary | ICD-10-CM | POA: Diagnosis not present

## 2020-12-01 DIAGNOSIS — E7800 Pure hypercholesterolemia, unspecified: Secondary | ICD-10-CM | POA: Diagnosis not present

## 2020-12-01 DIAGNOSIS — M810 Age-related osteoporosis without current pathological fracture: Secondary | ICD-10-CM | POA: Diagnosis not present

## 2020-12-01 DIAGNOSIS — K635 Polyp of colon: Secondary | ICD-10-CM | POA: Diagnosis not present

## 2020-12-01 DIAGNOSIS — Z0000 Encounter for general adult medical examination without abnormal findings: Secondary | ICD-10-CM | POA: Diagnosis not present

## 2020-12-01 DIAGNOSIS — M722 Plantar fascial fibromatosis: Secondary | ICD-10-CM | POA: Diagnosis not present

## 2020-12-04 ENCOUNTER — Other Ambulatory Visit: Payer: Self-pay | Admitting: Internal Medicine

## 2020-12-04 DIAGNOSIS — E7800 Pure hypercholesterolemia, unspecified: Secondary | ICD-10-CM

## 2020-12-25 ENCOUNTER — Ambulatory Visit
Admission: RE | Admit: 2020-12-25 | Discharge: 2020-12-25 | Disposition: A | Discharge status: Home / Self Care | Payer: No Typology Code available for payment source | Source: Ambulatory Visit | Attending: Internal Medicine | Admitting: Internal Medicine

## 2020-12-25 DIAGNOSIS — E7800 Pure hypercholesterolemia, unspecified: Secondary | ICD-10-CM

## 2021-02-06 ENCOUNTER — Telehealth: Payer: 59 | Admitting: Physician Assistant

## 2021-02-06 ENCOUNTER — Other Ambulatory Visit (HOSPITAL_COMMUNITY): Payer: Self-pay | Admitting: Physician Assistant

## 2021-02-06 ENCOUNTER — Encounter: Payer: Self-pay | Admitting: Physician Assistant

## 2021-02-06 DIAGNOSIS — B9689 Other specified bacterial agents as the cause of diseases classified elsewhere: Secondary | ICD-10-CM | POA: Diagnosis not present

## 2021-02-06 DIAGNOSIS — J019 Acute sinusitis, unspecified: Secondary | ICD-10-CM | POA: Diagnosis not present

## 2021-02-06 MED ORDER — AMOXICILLIN-POT CLAVULANATE 875-125 MG PO TABS: 1.0000 | ORAL_TABLET | Freq: Two times a day (BID) | ORAL | 0 refills | Status: DC

## 2021-02-06 NOTE — Progress Notes (Signed)
Ms. Tiffany Lopez, resetar are scheduled for a virtual visit with your provider today.    Just as we do with appointments in the office, we must obtain your consent to participate.  Your consent will be active for this visit and any virtual visit you may have with one of our providers in the next 365 days.    If you have a MyChart account, I can also send a copy of this consent to you electronically.  All virtual visits are billed to your insurance company just like a traditional visit in the office.  As this is a virtual visit, video technology does not allow for your provider to perform a traditional examination.  This may limit your provider's ability to fully assess your condition.  If your provider identifies any concerns that need to be evaluated in person or the need to arrange testing such as labs, EKG, etc, we will make arrangements to do so.    Although advances in technology are sophisticated, we cannot ensure that it will always work on either your end or our end.  If the connection with a video visit is poor, we may have to switch to a telephone visit.  With either a video or telephone visit, we are not always able to ensure that we have a secure connection.   I need to obtain your verbal consent now.   Are you willing to proceed with your visit today?   Tiffany Lopez has provided verbal consent on 02/06/2021 for a virtual visit (video or telephone).   Leeanne Rio, PA-C 02/06/2021  12:40 PM     Virtual Visit via Video   I connected with patient on 02/06/21 at 12:45 PM EST by a video enabled telemedicine application and verified that I am speaking with the correct person using two identifiers.  Location patient: Home Location provider: [facility] participating in the virtual visit: Patient, Provider  I discussed the limitations of evaluation and management by telemedicine and the availability of in person appointments. The patient expressed understanding and  agreed to proceed.  Subjective:   HPI:   Patient presents via [facility] today 5 days of worsening sinus symptoms.  Initially started last week with rhinorrhea and sneezing, subsequently developing sinus pressure, right ear pressure, headache.  Notes nasal drainage is gone from thin clear to thick and dark yellow.  Some sinus tenderness noted but without tooth pain.  Denies fever, chills or aches.  Denies any chest congestion, chest pain or shortness of breath.  Has been able to keep active during this, running 4 miles today.  Has not taken anything for her symptoms.  She is up to date on her Covid vaccines and boosters.  She has taken several home Covid test, all of which are negative.   ROS:   See pertinent positives and negatives per HPI.  There are no problems to display for this patient.   Social History   Tobacco Use  . Smoking status: Never Smoker  . Smokeless tobacco: Never Used  Substance Use Topics  . Alcohol use: Yes    Comment: rare    Current Outpatient Medications:  .  Alendronate Sodium (FOSAMAX PO), Take by mouth., Disp: , Rfl:  .  Calcium-Vitamin D 600-200 MG-UNIT per tablet, Take 1 tablet by mouth daily.  , Disp: , Rfl:   No Known Allergies  Objective:   There were no vitals taken for this visit.  Patient is well-developed, well-nourished in no acute distress.  Resting comfortably at home.  Head is normocephalic, atraumatic.  No labored breathing.  Speech is clear and coherent with logical content.  Patient is alert and oriented at baseline.   Assessment and Plan:   1. Acute bacterial sinusitis Rx Augmentin.  Increase fluids.  Rest.  Saline nasal spray.  Probiotic.  Mucinex as directed.  Humidifier in bedroom.  Start daily Flonase or Nasacort.  Xyzal recommended.  Call or return to clinic if symptoms are not improving.  - amoxicillin-clavulanate (AUGMENTIN) 875-125 MG tablet; Take 1 tablet by mouth 2 (two) times daily.  Dispense: 14 tablet; Refill:  0 .   Leeanne Rio, [place] 02/06/2021

## 2021-02-06 NOTE — Patient Instructions (Signed)
Instructions sent to patients MyChart.

## 2021-02-22 DIAGNOSIS — Z85828 Personal history of other malignant neoplasm of skin: Secondary | ICD-10-CM | POA: Diagnosis not present

## 2021-02-22 DIAGNOSIS — D2272 Melanocytic nevi of left lower limb, including hip: Secondary | ICD-10-CM | POA: Diagnosis not present

## 2021-02-22 DIAGNOSIS — L821 Other seborrheic keratosis: Secondary | ICD-10-CM | POA: Diagnosis not present

## 2021-02-22 DIAGNOSIS — D2271 Melanocytic nevi of right lower limb, including hip: Secondary | ICD-10-CM | POA: Diagnosis not present

## 2021-02-22 DIAGNOSIS — Z85820 Personal history of malignant melanoma of skin: Secondary | ICD-10-CM | POA: Diagnosis not present

## 2021-02-22 DIAGNOSIS — L309 Dermatitis, unspecified: Secondary | ICD-10-CM | POA: Diagnosis not present

## 2021-02-22 DIAGNOSIS — D1801 Hemangioma of skin and subcutaneous tissue: Secondary | ICD-10-CM | POA: Diagnosis not present

## 2021-02-22 DIAGNOSIS — L814 Other melanin hyperpigmentation: Secondary | ICD-10-CM | POA: Diagnosis not present

## 2021-03-06 ENCOUNTER — Other Ambulatory Visit (HOSPITAL_COMMUNITY): Payer: Self-pay | Admitting: Dermatology

## 2021-03-14 DIAGNOSIS — L820 Inflamed seborrheic keratosis: Secondary | ICD-10-CM | POA: Diagnosis not present

## 2021-03-14 DIAGNOSIS — Z85828 Personal history of other malignant neoplasm of skin: Secondary | ICD-10-CM | POA: Diagnosis not present

## 2021-03-14 DIAGNOSIS — D2272 Melanocytic nevi of left lower limb, including hip: Secondary | ICD-10-CM | POA: Diagnosis not present

## 2021-03-14 DIAGNOSIS — D485 Neoplasm of uncertain behavior of skin: Secondary | ICD-10-CM | POA: Diagnosis not present

## 2021-04-10 ENCOUNTER — Telehealth: Payer: 59 | Admitting: Physician Assistant

## 2021-04-10 ENCOUNTER — Other Ambulatory Visit (HOSPITAL_COMMUNITY): Payer: Self-pay

## 2021-04-10 DIAGNOSIS — B9689 Other specified bacterial agents as the cause of diseases classified elsewhere: Secondary | ICD-10-CM

## 2021-04-10 DIAGNOSIS — J019 Acute sinusitis, unspecified: Secondary | ICD-10-CM

## 2021-04-10 MED ORDER — DOXYCYCLINE HYCLATE 100 MG PO TABS
100.0000 mg | ORAL_TABLET | Freq: Two times a day (BID) | ORAL | 0 refills | Status: DC
  Filled 2021-04-10 (×2): qty 20, 10d supply, fill #0

## 2021-04-10 NOTE — Progress Notes (Signed)

## 2021-04-10 NOTE — Progress Notes (Signed)
I have spent 5 minutes in review of e-visit questionnaire, review and updating patient chart, medical decision making and response to patient.   Rosland Riding Cody Khloi Rawl, PA-C    

## 2021-05-04 DIAGNOSIS — H524 Presbyopia: Secondary | ICD-10-CM | POA: Diagnosis not present

## 2021-06-05 ENCOUNTER — Other Ambulatory Visit: Payer: Self-pay | Admitting: Internal Medicine

## 2021-06-05 DIAGNOSIS — Z1231 Encounter for screening mammogram for malignant neoplasm of breast: Secondary | ICD-10-CM

## 2021-06-08 ENCOUNTER — Other Ambulatory Visit: Payer: Self-pay

## 2021-06-08 ENCOUNTER — Ambulatory Visit
Admission: RE | Admit: 2021-06-08 | Discharge: 2021-06-08 | Disposition: A | Discharge status: Home / Self Care | Payer: 59 | Source: Ambulatory Visit | Attending: Internal Medicine | Admitting: Internal Medicine

## 2021-06-08 DIAGNOSIS — Z1231 Encounter for screening mammogram for malignant neoplasm of breast: Secondary | ICD-10-CM

## 2021-06-13 ENCOUNTER — Ambulatory Visit: Payer: 59 | Attending: Internal Medicine

## 2021-06-13 DIAGNOSIS — Z23 Encounter for immunization: Secondary | ICD-10-CM

## 2021-06-13 NOTE — Progress Notes (Signed)
   Covid-19 Vaccination Clinic  Name:  Tiffany Lopez    MRN: [id number] DOB: Jan 27, 1961  06/13/2021  Tiffany Lopez was observed post Covid-19 immunization for 15 minutes without incident. She was provided with Vaccine Information Sheet and instruction to access the V-Safe system.   Tiffany Lopez was instructed to call 911 with any severe reactions post vaccine: Difficulty breathing  Swelling of face and throat  A fast heartbeat  A bad rash all over body  Dizziness and weakness   Immunizations Administered     Name Date Dose VIS Date Route   PFIZER Comrnaty(Gray TOP) Covid-19 Vaccine 06/13/2021 11:29 AM 0.3 mL 11/23/2020 Intramuscular   Manufacturer: [place]   Lot: [id number]   NDC: [id number]

## 2021-06-14 ENCOUNTER — Other Ambulatory Visit (HOSPITAL_COMMUNITY): Payer: Self-pay

## 2021-06-14 MED ORDER — COVID-19 MRNA VAC-TRIS(PFIZER) 30 MCG/0.3ML IM SUSP
INTRAMUSCULAR | 0 refills | Status: DC
  Filled 2021-06-14: qty 0.3, 17d supply, fill #0

## 2021-08-30 ENCOUNTER — Encounter: Payer: Self-pay | Admitting: Gastroenterology

## 2021-08-30 ENCOUNTER — Other Ambulatory Visit (HOSPITAL_COMMUNITY): Payer: Self-pay

## 2021-08-30 DIAGNOSIS — D1801 Hemangioma of skin and subcutaneous tissue: Secondary | ICD-10-CM | POA: Diagnosis not present

## 2021-08-30 DIAGNOSIS — Z85828 Personal history of other malignant neoplasm of skin: Secondary | ICD-10-CM | POA: Diagnosis not present

## 2021-08-30 DIAGNOSIS — L218 Other seborrheic dermatitis: Secondary | ICD-10-CM | POA: Diagnosis not present

## 2021-08-30 DIAGNOSIS — L814 Other melanin hyperpigmentation: Secondary | ICD-10-CM | POA: Diagnosis not present

## 2021-08-30 DIAGNOSIS — Z85820 Personal history of malignant melanoma of skin: Secondary | ICD-10-CM | POA: Diagnosis not present

## 2021-08-30 DIAGNOSIS — L821 Other seborrheic keratosis: Secondary | ICD-10-CM | POA: Diagnosis not present

## 2021-08-30 DIAGNOSIS — D225 Melanocytic nevi of trunk: Secondary | ICD-10-CM | POA: Diagnosis not present

## 2021-08-30 MED ORDER — FLUOCINONIDE 0.05 % EX SOLN
CUTANEOUS | 2 refills | Status: DC
  Filled 2021-08-30: qty 60, 30d supply, fill #0

## 2021-10-19 ENCOUNTER — Other Ambulatory Visit: Payer: Self-pay

## 2021-10-19 ENCOUNTER — Other Ambulatory Visit (HOSPITAL_COMMUNITY): Payer: Self-pay

## 2021-10-19 ENCOUNTER — Encounter: Payer: Self-pay | Admitting: Gastroenterology

## 2021-10-19 ENCOUNTER — Ambulatory Visit (AMBULATORY_SURGERY_CENTER): Payer: 59 | Admitting: *Deleted

## 2021-10-19 VITALS — Ht 66.0 in | Wt 120.0 lb

## 2021-10-19 DIAGNOSIS — Z86010 Personal history of colonic polyps: Secondary | ICD-10-CM

## 2021-10-19 MED ORDER — NA SULFATE-K SULFATE-MG SULF 17.5-3.13-1.6 GM/177ML PO SOLN
1.0000 | ORAL | 0 refills | Status: DC
  Filled 2021-10-19: qty 354, 1d supply, fill #0

## 2021-10-19 NOTE — Progress Notes (Signed)

## 2021-11-02 ENCOUNTER — Other Ambulatory Visit: Payer: Self-pay

## 2021-11-02 ENCOUNTER — Encounter: Payer: Self-pay | Admitting: Gastroenterology

## 2021-11-02 ENCOUNTER — Ambulatory Visit (AMBULATORY_SURGERY_CENTER): Payer: 59 | Admitting: Gastroenterology

## 2021-11-02 VITALS — BP 93/51 | HR 53 | Temp 97.1°F | Resp 12 | Ht 66.0 in | Wt 120.0 lb

## 2021-11-02 DIAGNOSIS — D120 Benign neoplasm of cecum: Secondary | ICD-10-CM

## 2021-11-02 DIAGNOSIS — Z1211 Encounter for screening for malignant neoplasm of colon: Secondary | ICD-10-CM | POA: Diagnosis not present

## 2021-11-02 DIAGNOSIS — Z86010 Personal history of colonic polyps: Secondary | ICD-10-CM | POA: Diagnosis not present

## 2021-11-02 DIAGNOSIS — D125 Benign neoplasm of sigmoid colon: Secondary | ICD-10-CM | POA: Diagnosis not present

## 2021-11-02 DIAGNOSIS — K635 Polyp of colon: Secondary | ICD-10-CM

## 2021-11-02 MED ORDER — SODIUM CHLORIDE 0.9 % IV SOLN: 500.0000 mL | INTRAVENOUS | Status: DC

## 2021-11-02 NOTE — Progress Notes (Signed)
Called to room to assist during endoscopic procedure.  Patient ID and intended procedure confirmed with present staff. Received instructions for my participation in the procedure from the performing physician.  

## 2021-11-02 NOTE — Progress Notes (Signed)
Pt's states no medical or surgical changes since previsit or office visit. 

## 2021-11-02 NOTE — Patient Instructions (Signed)
Information on polyps and hemorrhoids given to you today.  Await pathology results.  Resume previous diet and medications.   YOU HAD AN ENDOSCOPIC PROCEDURE TODAY AT [facility]:   Refer to the procedure report that was given to you for any specific questions about what was found during the examination.  If the procedure report does not answer your questions, please call your gastroenterologist to clarify.  If you requested that your care partner not be given the details of your procedure findings, then the procedure report has been included in a sealed envelope for you to review at your convenience later.  YOU SHOULD EXPECT: Some feelings of bloating in the abdomen. Passage of more gas than usual.  Walking can help get rid of the air that was put into your GI tract during the procedure and reduce the bloating. If you had a lower endoscopy (such as a colonoscopy or flexible sigmoidoscopy) you may notice spotting of blood in your stool or on the toilet paper. If you underwent a bowel prep for your procedure, you may not have a normal bowel movement for a few days.  Please Note:  You might notice some irritation and congestion in your nose or some drainage.  This is from the oxygen used during your procedure.  There is no need for concern and it should clear up in a day or so.  SYMPTOMS TO REPORT IMMEDIATELY:  Following lower endoscopy (colonoscopy or flexible sigmoidoscopy):  Excessive amounts of blood in the stool  Significant tenderness or worsening of abdominal pains  Swelling of the abdomen that is new, acute  Fever of 100F or higher   For urgent or emergent issues, a gastroenterologist can be reached at any hour by calling [phone]. Do not use MyChart messaging for urgent concerns.    DIET:  We do recommend a small meal at first, but then you may proceed to your regular diet.  Drink plenty of fluids but you should avoid alcoholic beverages for 24  hours.  ACTIVITY:  You should plan to take it easy for the rest of today and you should NOT DRIVE or use heavy machinery until tomorrow (because of the sedation medicines used during the test).    FOLLOW UP: Our staff will call the number listed on your records 48-72 hours following your procedure to check on you and address any questions or concerns that you may have regarding the information given to you following your procedure. If we do not reach you, we will leave a message.  We will attempt to reach you two times.  During this call, we will ask if you have developed any symptoms of COVID 19. If you develop any symptoms (ie: fever, flu-like symptoms, shortness of breath, cough etc.) before then, please call [phone].  If you test positive for Covid 19 in the 2 weeks post procedure, please call and report this information to [place].    If any biopsies were taken you will be contacted by phone or by letter within the next 1-3 weeks.  Please call us at [phone] if you have not heard about the biopsies in 3 weeks.    SIGNATURES/CONFIDENTIALITY: You and/or your care partner have signed paperwork which will be entered into your electronic medical record.  These signatures attest to the fact that that the information above on your After Visit Summary has been reviewed and is understood.  Full responsibility of the confidentiality of this discharge information lies with you and/or  your care-partner.

## 2021-11-02 NOTE — Progress Notes (Signed)
[facility] History and Physical   Primary Care Physician:  Tisovec, Fransico Him, MD   Reason for Procedure:   History of colon polyps  Plan:    colonoscopy     HPI: Tiffany Lopez is a 60 y.o. female  here for colonoscopy surveillance - adenoma removed 07/2016. Patient denies any bowel symptoms at this time. Paternal grandfather had colon cancer known. Otherwise feels well without any cardiopulmonary symptoms.    Past Medical History:  Diagnosis Date   Allergy    Cancer [facility])    melanoma skin cancer    Past Surgical History:  Procedure Laterality Date   COLONOSCOPY  2017   Dr.Lon Klippel   DILATION AND CURETTAGE OF UTERUS  12/16/2005   POLYPECTOMY      Prior to Admission medications   Medication Sig Start Date End Date Taking? Authorizing Provider  Calcium-Vitamin D 600-200 MG-UNIT per tablet Take 1 tablet by mouth daily.     Yes [provider]  Cholecalciferol (DIALYVITE VITAMIN D 5000 PO) Take by mouth.   Yes [provider]  FIBER PO Take 2 capsules by mouth.   Yes [provider]  fluocinonide (LIDEX) 0.05 % external solution Apply 1 mL to the scalp twice a day 08/30/21  Yes   hydrocortisone 2.5 % cream APPLY 1 APPLICATION ON THE SKIN TWICE A DAY 03/06/21 03/06/22 Yes Jarome Matin, MD  Na Sulfate-K Sulfate-Mg Sulf 17.5-3.13-1.6 GM/177ML SOLN Take 1 kit by mouth as directed. 10/19/21  Yes Baldo Hufnagle, Carlota Raspberry, MD  Omega-3 Fatty Acids (FISH OIL) 1000 MG CAPS Take by mouth.   Yes [provider]    Current Outpatient Medications  Medication Sig Dispense Refill   Calcium-Vitamin D 600-200 MG-UNIT per tablet Take 1 tablet by mouth daily.       Cholecalciferol (DIALYVITE VITAMIN D 5000 PO) Take by mouth.     FIBER PO Take 2 capsules by mouth.     fluocinonide (LIDEX) 0.05 % external solution Apply 1 mL to the scalp twice a day 60 mL 2   hydrocortisone 2.5 % cream APPLY 1 APPLICATION ON THE SKIN TWICE A DAY 30 g 0   Na Sulfate-K  Sulfate-Mg Sulf 17.5-3.13-1.6 GM/177ML SOLN Take 1 kit by mouth as directed. 354 mL 0   Omega-3 Fatty Acids (FISH OIL) 1000 MG CAPS Take by mouth.     Current Facility-Administered Medications  Medication Dose Route Frequency Provider Last Rate Last Admin   0.9 %  sodium chloride infusion  500 mL Intravenous Continuous Willette Mudry, Carlota Raspberry, MD        Allergies as of 11/02/2021   (No Known Allergies)    Family History  Problem Relation Age of Onset   Colon cancer Paternal Grandmother    Esophageal cancer Neg Hx    Stomach cancer Neg Hx    Rectal cancer Neg Hx     Social History   Socioeconomic History   Marital status: Married    Spouse name: Not on file   Number of children: Not on file   Years of education: Not on file   Highest education level: Not on file  Occupational History   Not on file  Tobacco Use   Smoking status: Never   Smokeless tobacco: Never  Vaping Use   Vaping Use: Never used  Substance and Sexual Activity   Alcohol use: Not Currently    Comment: rare   Drug use: No   Sexual activity: Not on file  Other Topics Concern  Not on file  Social History Narrative   Not on file   Social Determinants of Health   Financial Resource Strain: Not on file  Food Insecurity: Not on file  Transportation Needs: Not on file  Physical Activity: Not on file  Stress: Not on file  Social Connections: Not on file  Intimate Partner Violence: Not on file    Review of Systems: All other review of systems negative except as mentioned in the HPI.  Physical Exam: Vital signs BP 121/65   Pulse 69   Temp (!) 97.1 F (36.2 C)   Resp 17   Ht $R'5\' 6"'Pe$  (1.676 m)   Wt 120 lb (54.4 kg)   SpO2 100%   BMI 19.37 kg/m   General:   Alert,  Well-developed, pleasant and cooperative in NAD Lungs:  Clear throughout to auscultation.   Heart:  Regular rate and rhythm Abdomen:  Soft, nontender and nondistended.   Neuro/Psych:  Alert and cooperative. Normal mood and affect. A  and O x 3  Jolly Mango, MD [facility]

## 2021-11-02 NOTE — Op Note (Signed)
[facility] Patient Name: Tiffany Lopez Procedure Date: 11/02/2021 9:09 AM MRN: [id number] Endoscopist: Remo Lipps P. Havery Moros , MD Age: 60 Referring MD:  Date of Birth: [birth year] Gender: Female Account #: [id number] Procedure:                Colonoscopy Indications:              High risk colon cancer surveillance: Personal                            history of colonic polyps (adenoma removed 06/2016) Medicines:                Monitored Anesthesia Care Procedure:                Pre-Anesthesia Assessment:                           - Prior to the procedure, a History and Physical                            was performed, and patient medications and                            allergies were reviewed. The patient's tolerance of                            previous anesthesia was also reviewed. The risks                            and benefits of the procedure and the sedation                            options and risks were discussed with the patient.                            All questions were answered, and informed consent                            was obtained. Prior Anticoagulants: The patient has                            taken no previous anticoagulant or antiplatelet                            agents. ASA Grade Assessment: II - A patient with                            mild systemic disease. After reviewing the risks                            and benefits, the patient was deemed in                            satisfactory condition to undergo the procedure.  After obtaining informed consent, the colonoscope                            was passed under direct vision. Throughout the                            procedure, the patient's blood pressure, pulse, and                            oxygen saturations were monitored continuously. The                            PCF-HQ190L Colonoscope was introduced through the                            anus  and advanced to the the cecum, identified by                            appendiceal orifice and ileocecal valve. The                            colonoscopy was performed without difficulty. The                            patient tolerated the procedure well. The quality                            of the bowel preparation was good. The ileocecal                            valve, appendiceal orifice, and rectum were                            photographed. Scope In: 9:10:10 AM Scope Out: 9:31:53 AM Scope Withdrawal Time: 0 hours 18 minutes 19 seconds  Total Procedure Duration: 0 hours 21 minutes 43 seconds  Findings:                 The perianal and digital rectal examinations were                            normal.                           A diminutive polyp was found in the cecum. The                            polyp was sessile. The polyp was removed with a                            cold snare. Resection and retrieval were complete.                           A 7 to 8 mm polyp was found in the sigmoid colon.  The polyp was pedunculated. The polyp was removed                            with a hot snare. Resection and retrieval were                            complete.                           Internal hemorrhoids were found during                            retroflexion. The hemorrhoids were small.                           The exam was otherwise without abnormality. Complications:            No immediate complications. Estimated blood loss:                            Minimal. Estimated Blood Loss:     Estimated blood loss was minimal. Impression:               - One diminutive polyp in the cecum, removed with a                            cold snare. Resected and retrieved.                           - One 7 to 8 mm polyp in the sigmoid colon, removed                            with a hot snare. Resected and retrieved.                           - Internal  hemorrhoids.                           - The examination was otherwise normal. Recommendation:           - Patient has a contact number available for                            emergencies. The signs and symptoms of potential                            delayed complications were discussed with the                            patient. Return to normal activities tomorrow.                            Written discharge instructions were provided to the                            patient.                           -  Resume previous diet.                           - Continue present medications.                           - Await pathology results. Remo Lipps P. Christy Ehrsam, MD 11/02/2021 9:35:12 AM This report has been signed electronically.

## 2021-11-02 NOTE — Progress Notes (Signed)
A and O x3. Report to RN. Tolerated MAC anesthesia well. 

## 2021-11-06 ENCOUNTER — Telehealth: Payer: Self-pay | Admitting: *Deleted

## 2021-11-06 NOTE — Telephone Encounter (Signed)
  Follow up Call-  Call back number 11/02/2021  Post procedure Call Back phone  # [phone]  Permission to leave phone message Yes  Some recent data might be hidden     Patient questions:  Do you have a fever, pain , or abdominal swelling? No. Pain Score  0 *  Have you tolerated food without any problems? Yes.    Have you been able to return to your normal activities? Yes.    Do you have any questions about your discharge instructions: Diet   No. Medications  No. Follow up visit  No.  Do you have questions or concerns about your Care? No.  Actions: * If pain score is 4 or above: No action needed, pain <4.  Have you developed a fever since your procedure? no  2.   Have you had an respiratory symptoms (SOB or cough) since your procedure? no  3.   Have you tested positive for COVID 19 since your procedure no  4.   Have you had any family members/close contacts diagnosed with the COVID 19 since your procedure?  no   If yes to any of these questions please route to Joylene John, RN and Joella Prince, RN

## 2021-12-13 ENCOUNTER — Emergency Department (HOSPITAL_COMMUNITY): Payer: 59

## 2021-12-13 ENCOUNTER — Other Ambulatory Visit: Payer: Self-pay

## 2021-12-13 ENCOUNTER — Ambulatory Visit: Payer: 59 | Admitting: Orthopaedic Surgery

## 2021-12-13 ENCOUNTER — Encounter: Payer: Self-pay | Admitting: Orthopaedic Surgery

## 2021-12-13 ENCOUNTER — Encounter (HOSPITAL_COMMUNITY): Payer: Self-pay | Admitting: Emergency Medicine

## 2021-12-13 ENCOUNTER — Emergency Department (HOSPITAL_COMMUNITY)
Admission: EM | Admit: 2021-12-13 | Discharge: 2021-12-13 | Disposition: A | Discharge status: Home / Self Care | Payer: 59 | Attending: Emergency Medicine | Admitting: Emergency Medicine

## 2021-12-13 DIAGNOSIS — S52021A Displaced fracture of olecranon process without intraarticular extension of right ulna, initial encounter for closed fracture: Secondary | ICD-10-CM

## 2021-12-13 DIAGNOSIS — S022XXA Fracture of nasal bones, initial encounter for closed fracture: Secondary | ICD-10-CM | POA: Diagnosis not present

## 2021-12-13 DIAGNOSIS — W19XXXA Unspecified fall, initial encounter: Secondary | ICD-10-CM

## 2021-12-13 DIAGNOSIS — W01198A Fall on same level from slipping, tripping and stumbling with subsequent striking against other object, initial encounter: Secondary | ICD-10-CM | POA: Insufficient documentation

## 2021-12-13 DIAGNOSIS — Z85828 Personal history of other malignant neoplasm of skin: Secondary | ICD-10-CM | POA: Diagnosis not present

## 2021-12-13 DIAGNOSIS — Z8709 Personal history of other diseases of the respiratory system: Secondary | ICD-10-CM | POA: Diagnosis not present

## 2021-12-13 DIAGNOSIS — S59901A Unspecified injury of right elbow, initial encounter: Secondary | ICD-10-CM | POA: Diagnosis present

## 2021-12-13 DIAGNOSIS — M7989 Other specified soft tissue disorders: Secondary | ICD-10-CM | POA: Diagnosis not present

## 2021-12-13 NOTE — ED Notes (Signed)
Paged ortho tech again.

## 2021-12-13 NOTE — Discharge Instructions (Addendum)
You were seen in the emergency department today after a fall.  Your x-ray showed that you broke your right elbow and your nose. I've attached the contact information for the orthopedist for you to call and make an appointment if you don't hear back today. Please use Tylenol or ibuprofen for pain.  You may use 600 mg ibuprofen every 6 hours or 1000 mg of Tylenol every 6 hours.  You may choose to alternate between the 2.  This would be most effective.  Do not exceed 4 g of Tylenol within 24 hours.  Do not exceed 3200 mg ibuprofen within 24 hours.  Continue to monitor how you're doing and return to the ER for new or worsening symptoms such as difficulty breathing, numbness or tingling in your arm or hand.   It has been a pleasure seeing and caring for you today and I hope you start feeling better soon!

## 2021-12-13 NOTE — ED Notes (Signed)
Ortho tech paged  

## 2021-12-13 NOTE — ED Notes (Signed)
Pt left without splint.  States she has 1pm appt with Dr. Ninfa Linden.

## 2021-12-13 NOTE — Progress Notes (Signed)
[name] is a right-hand-dominant 60 year old female who sustained an axial mechanical fall landing on her right elbow today.  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  She reports significant right elbow pain.  She was seen in the emergency room and found to have a nasal fracture as well as a fracture of her right elbow olecranon.  She is not in splint and says she does not really need any pain medications.  She denies any numbness and tingling in her hand.  She does not have any acute significant medical issues at all.  Her husband is with her today.  She is incredibly active.  There is listed no headache, chest pain, shortness of breath, fever, chills, nausea, vomiting  On examination she can gently flex and extend her right elbow but not fully.  There is significant bruising and swelling all around the right elbow.  The skin is intact.  She has neurovascular intact of the right upper extremity.  X-rays of the right elbow show a comminuted olecranon fracture.  I am not can put her in a splint because I think she will do well out of the splint so she is not having much pain but we can try a sling for her.  That way she will be able to shower.  I did explain in detail the nature of her fracture and that surgery would be recommended for fracture such as this.   We will see about getting her set up for surgery sometime mid-to-late week next week.  She understands this as well.  All questions and concerns were answered and addressed.  We will be in touch about scheduling surgery.

## 2021-12-13 NOTE — ED Triage Notes (Addendum)
Pt tripped over brick retaining wall while going into work.  C/o pain and swelling to R elbow.  CMS intact.  Abrasions to lower legs, hands, nose, and above mouth.  Small lac to bridge of nose.  Bleeding controlled.  Denies LOC.  No blood thinners.  Pt changed into gown at triage.  R arm sling and ice pack given.

## 2021-12-13 NOTE — ED Provider Notes (Signed)
Emergency Medicine Provider Triage Evaluation Note  Tiffany Lopez , a 60 y.o. female  was evaluated in triage.  Pt complains of mechanical fall while going to work about an hour and a half prior to arrival.  He states that she tripped over a brick retaining wall, and is complaining of pain and swelling in her right elbow and her nose.  There is no loss of consciousness, she is not on chronic anticoagulation.  Review of Systems  Positive: Fall Negative: Numbness, tingling, dizziness, weakness, LOC   Physical Exam  There were no vitals taken for this visit. Gen:   Awake, no distress   Resp:  Normal effort  MSK:   Moves extremities without difficulty  Other:  Abrasions to lower legs, hands, nose, and above mouth; no lacerations requiring suture repair from my view, bleeding is controlled  Neurovascularly intact in all extremities  Medical Decision Making  Medically screening exam initiated at 9:14 AM.  Appropriate orders placed.  SAHAR RYBACK was informed that the remainder of the evaluation will be completed by another provider, this initial triage assessment does not replace that evaluation, and the importance of remaining in the ED until their evaluation is complete.  Patient placed in right arm sling, with ice pack given Will obtain x-rays of right elbow and nose   Kateri Plummer, PA-C 12/13/21 [id number]    Wyvonnia Dusky, MD 12/13/21 1000

## 2021-12-13 NOTE — ED Provider Notes (Signed)
Tiffany Lopez   CSN: [id number] Arrival date & time: 12/13/21  3532     History Chief Complaint  Patient presents with   Fall   Elbow Pain    Tiffany Lopez is a 60 y.o. female complains of mechanical fall while going to work about an hour and a half prior to arrival.  He states that she tripped over a brick retaining wall, and is complaining of pain and swelling in her right elbow and her nose.  There was no loss of consciousness. She is not on chronic anticoagulation.   Fall Pertinent negatives include no chest pain and no shortness of breath.      Past Medical History:  Diagnosis Date   Allergy    Cancer ([place])    melanoma skin cancer    There are no problems to display for this patient.   Past Surgical History:  Procedure Laterality Date   COLONOSCOPY  2017   Dr.Armbruster   DILATION AND CURETTAGE OF UTERUS  12/16/2005   POLYPECTOMY       OB History   No obstetric history on file.     Family History  Problem Relation Age of Onset   Colon cancer Paternal Grandmother    Esophageal cancer Neg Hx    Stomach cancer Neg Hx    Rectal cancer Neg Hx     Social History   Tobacco Use   Smoking status: Never   Smokeless tobacco: Never  Vaping Use   Vaping Use: Never used  Substance Use Topics   Alcohol use: Not Currently    Comment: rare   Drug use: No    Home Medications Prior to Admission medications   Medication Sig Start Date End Date Taking? Authorizing Provider  Calcium-Vitamin D 600-200 MG-UNIT per tablet Take 1 tablet by mouth daily.      [provider]  Cholecalciferol (DIALYVITE VITAMIN D 5000 PO) Take by mouth.    [provider]  FIBER PO Take 2 capsules by mouth.    [provider]  fluocinonide (LIDEX) 0.05 % external solution Apply 1 mL to the scalp twice a day 08/30/21     hydrocortisone 2.5 % cream APPLY 1 APPLICATION ON THE SKIN TWICE A DAY 03/06/21 03/06/22   Jarome Matin, MD  Na Sulfate-K Sulfate-Mg Sulf 17.5-3.13-1.6 GM/177ML SOLN Take 1 kit by mouth as directed. 10/19/21   Armbruster, Carlota Raspberry, MD  Omega-3 Fatty Acids (FISH OIL) 1000 MG CAPS Take by mouth.    [provider]    Allergies    Patient has no known allergies.  Review of Systems   Review of Systems  Respiratory:  Negative for shortness of breath.   Cardiovascular:  Negative for chest pain.  Musculoskeletal:        Right elbow pain, Nose pain  Skin:        Abrasions on lower legs, nose, and R hand  Neurological:  Negative for dizziness, syncope, weakness, light-headedness and numbness.  All other systems reviewed and are negative.  Physical Exam Updated Vital Signs BP (!) 118/57 (BP Location: Left Arm)    Pulse (!) 55    Temp 97.8 F (36.6 C) (Oral)    Resp 16    SpO2 100%   Physical Exam Vitals and nursing Lopez reviewed.  Constitutional:      Appearance: Normal appearance.  HENT:     Head: Normocephalic.     Nose: Nasal deformity and nasal tenderness  present.     Comments: Able to breathe through nose without difficulty Eyes:     Conjunctiva/sclera: Conjunctivae normal.  Cardiovascular:     Pulses:          Radial pulses are 1+ on the right side and 1+ on the left side.  Pulmonary:     Effort: Pulmonary effort is normal. No respiratory distress.  Musculoskeletal:     Comments: Decreased elbow extension due to pain. Sensation in tact, good capillary refill.   Skin:    General: Skin is warm and dry.     Capillary Refill: Capillary refill takes less than 2 seconds.     Comments: Superficial abrasions over nose, right elbow, and bilateral lower legs  Neurological:     Mental Status: She is alert.  Psychiatric:        Mood and Affect: Mood normal.        Behavior: Behavior normal.    ED Results / Procedures / Treatments   Labs (all labs ordered are listed, but only abnormal results are displayed) Labs Reviewed - No data to  display  EKG None  Radiology DG Nasal Bones  Result Date: 12/13/2021 CLINICAL DATA:  Status post fall. Laceration and pain to nasal bridge. EXAM: NASAL BONES - 3+ VIEW COMPARISON:  None FINDINGS: There is a nondisplaced fracture deformity identified at the base of the nasal bone. No additional fractures identified. The paranasal sinuses appear well aerated. IMPRESSION: Nondisplaced fracture involves the base of the nasal bone. Electronically Signed   By: Kerby Moors M.D.   On: 12/13/2021 09:53   DG Elbow Complete Right  Result Date: 12/13/2021 CLINICAL DATA:  Status post fall, elbow pain EXAM: RIGHT ELBOW - COMPLETE 3+ VIEW COMPARISON:  None. FINDINGS: Comminuted fracture of the olecranon process with 19 mm of distraction between the major fracture fragments. Severe overlying soft tissue swelling. Elbow joint effusion. No other fracture or dislocation. IMPRESSION: 1. Comminuted fracture of the olecranon process with 19 mm of distraction between the major fracture fragments. Severe overlying soft tissue swelling. Electronically Signed   By: Kathreen Devoid M.D.   On: 12/13/2021 09:50    Procedures Procedures   Medications Ordered in ED Medications - No data to display  ED Course  I have reviewed the triage vital signs and the nursing notes.  Pertinent labs & imaging results that were available during my care of the patient were reviewed by me and considered in my medical decision making (see chart for details).    MDM Rules/Calculators/A&P                          Patient is an otherwise healthy 60 year old female presents the emergency department after a fall occurring about an hour and a half prior to ER arrival.  Patient states that she tripped over a brick retaining wall, and is complaining of pain in her right elbow and her nose.  There is no loss of consciousness.  She is not on chronic anticoagulation.  On my exam patient has a nasal deformity with superficial abrasions, she is  able to breathe through her nose without difficulty.  She is holding her right arm in passive flexion, and she is neurovascular intact in her bilateral upper extremities.  She has superficial abrasions over her right elbow as well as her bilateral legs, none requiring suture repair.  X-rays of the nose and the right elbow show closed fractures.  Olecranon fracture with approximately  19 mm of separation and soft tissue swelling.  Right arm splinted, and nasal abrasions appropriately dressed.  Patient is not requiring admission or inpatient treatment for her symptoms.  She has denied any pain medication at this time, and she has already made an appointment with the orthopedic doctor this afternoon for follow-up.  Discussed reasons to return the emergency department, patient agreeable to plan.  Final Clinical Impression(s) / ED Diagnoses Final diagnoses:  Fall, initial encounter  Olecranon fracture, right, closed, initial encounter  Closed fracture of nasal bone, initial encounter    Rx / DC Orders ED Discharge Orders     None      Portions of this report may have been transcribed using voice recognition software. Every effort was made to ensure accuracy; however, inadvertent computerized transcription errors may be present.    Estill Cotta 12/13/21 1214    Wyvonnia Dusky, MD 12/13/21 1425

## 2021-12-13 NOTE — ED Notes (Signed)
Pt discharged from triage by PA.  Awaiting ortho tech.

## 2021-12-14 ENCOUNTER — Other Ambulatory Visit: Payer: Self-pay

## 2021-12-14 ENCOUNTER — Encounter (HOSPITAL_BASED_OUTPATIENT_CLINIC_OR_DEPARTMENT_OTHER): Payer: Self-pay | Admitting: Orthopaedic Surgery

## 2021-12-19 ENCOUNTER — Ambulatory Visit (HOSPITAL_COMMUNITY): Payer: 59

## 2021-12-19 ENCOUNTER — Ambulatory Visit (HOSPITAL_BASED_OUTPATIENT_CLINIC_OR_DEPARTMENT_OTHER): Payer: 59 | Admitting: Anesthesiology

## 2021-12-19 ENCOUNTER — Other Ambulatory Visit (HOSPITAL_COMMUNITY): Payer: Self-pay

## 2021-12-19 ENCOUNTER — Encounter (HOSPITAL_BASED_OUTPATIENT_CLINIC_OR_DEPARTMENT_OTHER)
Admission: RE | Disposition: A | Discharge status: Home / Self Care | Payer: Self-pay | Source: Home / Self Care | Attending: Orthopaedic Surgery

## 2021-12-19 ENCOUNTER — Encounter (HOSPITAL_BASED_OUTPATIENT_CLINIC_OR_DEPARTMENT_OTHER): Payer: Self-pay | Admitting: Orthopaedic Surgery

## 2021-12-19 ENCOUNTER — Other Ambulatory Visit: Payer: Self-pay

## 2021-12-19 ENCOUNTER — Ambulatory Visit (HOSPITAL_BASED_OUTPATIENT_CLINIC_OR_DEPARTMENT_OTHER)
Admission: RE | Admit: 2021-12-19 | Discharge: 2021-12-19 | Disposition: A | Discharge status: Home / Self Care | Payer: 59 | Attending: Orthopaedic Surgery | Admitting: Orthopaedic Surgery

## 2021-12-19 DIAGNOSIS — X58XXXA Exposure to other specified factors, initial encounter: Secondary | ICD-10-CM | POA: Diagnosis not present

## 2021-12-19 DIAGNOSIS — S42401D Unspecified fracture of lower end of right humerus, subsequent encounter for fracture with routine healing: Secondary | ICD-10-CM | POA: Diagnosis not present

## 2021-12-19 DIAGNOSIS — G8918 Other acute postprocedural pain: Secondary | ICD-10-CM | POA: Diagnosis not present

## 2021-12-19 DIAGNOSIS — S52021A Displaced fracture of olecranon process without intraarticular extension of right ulna, initial encounter for closed fracture: Secondary | ICD-10-CM

## 2021-12-19 DIAGNOSIS — Z419 Encounter for procedure for purposes other than remedying health state, unspecified: Secondary | ICD-10-CM

## 2021-12-19 HISTORY — DX: Psoriasis, unspecified: L40.9

## 2021-12-19 HISTORY — DX: Displaced fracture of olecranon process without intraarticular extension of right ulna, initial encounter for closed fracture: S52.021A

## 2021-12-19 HISTORY — PX: ORIF ELBOW FRACTURE: SHX5031

## 2021-12-19 SURGERY — OPEN REDUCTION INTERNAL FIXATION (ORIF) ELBOW/OLECRANON FRACTURE
Anesthesia: General | Site: Elbow | Laterality: Right

## 2021-12-19 MED ORDER — LIDOCAINE HCL (CARDIAC) PF 100 MG/5ML IV SOSY
PREFILLED_SYRINGE | INTRAVENOUS | Status: DC | PRN
  Administered 2021-12-19: 40 mg via INTRAVENOUS

## 2021-12-19 MED ORDER — FENTANYL CITRATE (PF) 100 MCG/2ML IJ SOLN
INTRAMUSCULAR | Status: AC
  Filled 2021-12-19: qty 2

## 2021-12-19 MED ORDER — FENTANYL CITRATE (PF) 100 MCG/2ML IJ SOLN
100.0000 ug | Freq: Once | INTRAMUSCULAR | Status: AC
  Administered 2021-12-19: 50 ug via INTRAVENOUS

## 2021-12-19 MED ORDER — ROCURONIUM BROMIDE 10 MG/ML (PF) SYRINGE
PREFILLED_SYRINGE | INTRAVENOUS | Status: AC
  Filled 2021-12-19: qty 10

## 2021-12-19 MED ORDER — OXYCODONE HCL 5 MG/5ML PO SOLN: 5.0000 mg | Freq: Once | ORAL | Status: DC | PRN

## 2021-12-19 MED ORDER — PROMETHAZINE HCL 25 MG/ML IJ SOLN: 6.2500 mg | INTRAMUSCULAR | Status: DC | PRN

## 2021-12-19 MED ORDER — DEXAMETHASONE SODIUM PHOSPHATE 10 MG/ML IJ SOLN
INTRAMUSCULAR | Status: AC
  Filled 2021-12-19: qty 1

## 2021-12-19 MED ORDER — HYDROMORPHONE HCL 1 MG/ML IJ SOLN: 0.2500 mg | INTRAMUSCULAR | Status: DC | PRN

## 2021-12-19 MED ORDER — EPHEDRINE SULFATE 50 MG/ML IJ SOLN
INTRAMUSCULAR | Status: DC | PRN
  Administered 2021-12-19: 10 mg via INTRAVENOUS
  Administered 2021-12-19: 5 mg via INTRAVENOUS
  Administered 2021-12-19: 10 mg via INTRAVENOUS

## 2021-12-19 MED ORDER — SCOPOLAMINE 1 MG/3DAYS TD PT72
1.0000 | MEDICATED_PATCH | TRANSDERMAL | Status: DC
  Administered 2021-12-19: 1.5 mg via TRANSDERMAL

## 2021-12-19 MED ORDER — ONDANSETRON HCL 4 MG/2ML IJ SOLN
INTRAMUSCULAR | Status: AC
  Filled 2021-12-19: qty 2

## 2021-12-19 MED ORDER — SCOPOLAMINE 1 MG/3DAYS TD PT72
MEDICATED_PATCH | TRANSDERMAL | Status: AC
  Filled 2021-12-19: qty 1

## 2021-12-19 MED ORDER — SUGAMMADEX SODIUM 200 MG/2ML IV SOLN
INTRAVENOUS | Status: DC | PRN
Start: 2021-12-19 — End: 2021-12-19
  Administered 2021-12-19: 110.6 mg via INTRAVENOUS

## 2021-12-19 MED ORDER — PHENYLEPHRINE HCL (PRESSORS) 10 MG/ML IV SOLN
INTRAVENOUS | Status: DC | PRN
Start: 2021-12-19 — End: 2021-12-19
  Administered 2021-12-19: 80 ug via INTRAVENOUS
  Administered 2021-12-19 (×2): 40 ug via INTRAVENOUS

## 2021-12-19 MED ORDER — LIDOCAINE 2% (20 MG/ML) 5 ML SYRINGE
INTRAMUSCULAR | Status: AC
  Filled 2021-12-19: qty 5

## 2021-12-19 MED ORDER — VANCOMYCIN HCL 500 MG IV SOLR
INTRAVENOUS | Status: DC | PRN
  Administered 2021-12-19: 500 mg via TOPICAL

## 2021-12-19 MED ORDER — BUPIVACAINE LIPOSOME 1.3 % IJ SUSP
INTRAMUSCULAR | Status: DC | PRN
  Administered 2021-12-19: 10 mL via PERINEURAL

## 2021-12-19 MED ORDER — OXYCODONE HCL 5 MG PO TABS: 5.0000 mg | ORAL_TABLET | Freq: Once | ORAL | Status: DC | PRN

## 2021-12-19 MED ORDER — MIDAZOLAM HCL 2 MG/2ML IJ SOLN
INTRAMUSCULAR | Status: AC
  Filled 2021-12-19: qty 2

## 2021-12-19 MED ORDER — ONDANSETRON HCL 4 MG/2ML IJ SOLN
INTRAMUSCULAR | Status: DC | PRN
Start: 2021-12-19 — End: 2021-12-19
  Administered 2021-12-19: 4 mg via INTRAVENOUS

## 2021-12-19 MED ORDER — ACETAMINOPHEN 500 MG PO TABS
ORAL_TABLET | ORAL | Status: AC
  Filled 2021-12-19: qty 2

## 2021-12-19 MED ORDER — VANCOMYCIN HCL 500 MG IV SOLR
INTRAVENOUS | Status: AC
  Filled 2021-12-19: qty 10

## 2021-12-19 MED ORDER — ROCURONIUM BROMIDE 100 MG/10ML IV SOLN
INTRAVENOUS | Status: DC | PRN
  Administered 2021-12-19: 50 mg via INTRAVENOUS

## 2021-12-19 MED ORDER — CEFAZOLIN SODIUM-DEXTROSE 2-4 GM/100ML-% IV SOLN
2.0000 g | INTRAVENOUS | Status: AC
  Administered 2021-12-19: 2 g via INTRAVENOUS

## 2021-12-19 MED ORDER — 0.9 % SODIUM CHLORIDE (POUR BTL) OPTIME
TOPICAL | Status: DC | PRN
  Administered 2021-12-19: 2500 mL

## 2021-12-19 MED ORDER — LACTATED RINGERS IV SOLN: INTRAVENOUS | Status: DC

## 2021-12-19 MED ORDER — BUPIVACAINE-EPINEPHRINE (PF) 0.5% -1:200000 IJ SOLN
INTRAMUSCULAR | Status: DC | PRN
  Administered 2021-12-19: 5 mL via PERINEURAL

## 2021-12-19 MED ORDER — MIDAZOLAM HCL 2 MG/2ML IJ SOLN
2.0000 mg | Freq: Once | INTRAMUSCULAR | Status: AC
  Administered 2021-12-19: 1 mg via INTRAVENOUS

## 2021-12-19 MED ORDER — PROPOFOL 10 MG/ML IV BOLUS
INTRAVENOUS | Status: DC | PRN
  Administered 2021-12-19: 150 mg via INTRAVENOUS

## 2021-12-19 MED ORDER — ACETAMINOPHEN 500 MG PO TABS
1000.0000 mg | ORAL_TABLET | Freq: Once | ORAL | Status: AC
  Administered 2021-12-19: 1000 mg via ORAL

## 2021-12-19 MED ORDER — DEXAMETHASONE SODIUM PHOSPHATE 4 MG/ML IJ SOLN
INTRAMUSCULAR | Status: DC | PRN
Start: 2021-12-19 — End: 2021-12-19
  Administered 2021-12-19: 4 mg via INTRAVENOUS

## 2021-12-19 MED ORDER — MIDAZOLAM HCL 2 MG/2ML IJ SOLN: 0.5000 mg | Freq: Once | INTRAMUSCULAR | Status: DC | PRN

## 2021-12-19 MED ORDER — OXYCODONE-ACETAMINOPHEN 5-325 MG PO TABS
1.0000 | ORAL_TABLET | Freq: Three times a day (TID) | ORAL | 0 refills | Status: DC | PRN
  Filled 2021-12-19: qty 30, 5d supply, fill #0

## 2021-12-19 MED ORDER — CEFAZOLIN SODIUM-DEXTROSE 2-4 GM/100ML-% IV SOLN
INTRAVENOUS | Status: AC
  Filled 2021-12-19: qty 100

## 2021-12-19 MED ORDER — MEPERIDINE HCL 25 MG/ML IJ SOLN: 6.2500 mg | INTRAMUSCULAR | Status: DC | PRN

## 2021-12-19 SURGICAL SUPPLY — 80 items
ADH SKN CLS APL DERMABOND .7 (GAUZE/BANDAGES/DRESSINGS)
BIT DRILL CALIBRATED 2.7 (BIT) ×1 IMPLANT
BLADE HEX COATED 2.75 (ELECTRODE) ×2 IMPLANT
BLADE SURG 15 STRL LF DISP TIS (BLADE) ×2 IMPLANT
BLADE SURG 15 STRL SS (BLADE) ×4
BNDG CMPR 9X4 STRL LF SNTH (GAUZE/BANDAGES/DRESSINGS)
BNDG COHESIVE 4X5 TAN ST LF (GAUZE/BANDAGES/DRESSINGS) ×3 IMPLANT
BNDG ELASTIC 3X5.8 VLCR STR LF (GAUZE/BANDAGES/DRESSINGS) ×1 IMPLANT
BNDG ELASTIC 4X5.8 VLCR STR LF (GAUZE/BANDAGES/DRESSINGS) ×6 IMPLANT
BNDG ESMARK 4X9 LF (GAUZE/BANDAGES/DRESSINGS) ×2 IMPLANT
CORD BIPOLAR FORCEPS 12FT (ELECTRODE) ×3 IMPLANT
COVER BACK TABLE 60X90IN (DRAPES) ×3 IMPLANT
CUFF TOURN SGL QUICK 18X3 (MISCELLANEOUS) ×5 IMPLANT
DECANTER SPIKE VIAL GLASS SM (MISCELLANEOUS) IMPLANT
DERMABOND ADVANCED (GAUZE/BANDAGES/DRESSINGS)
DERMABOND ADVANCED .7 DNX12 (GAUZE/BANDAGES/DRESSINGS) ×2 IMPLANT
DRAPE C-ARM 42X72 X-RAY (DRAPES) ×3 IMPLANT
DRAPE EXTREMITY T 121X128X90 (DISPOSABLE) ×3 IMPLANT
DRAPE IMP U-DRAPE 54X76 (DRAPES) ×3 IMPLANT
DRAPE INCISE IOBAN 66X45 STRL (DRAPES) ×3 IMPLANT
DRAPE SURG 17X23 STRL (DRAPES) ×2 IMPLANT
GAUZE SPONGE 4X4 12PLY STRL (GAUZE/BANDAGES/DRESSINGS) ×3 IMPLANT
GAUZE XEROFORM 1X8 LF (GAUZE/BANDAGES/DRESSINGS) ×3 IMPLANT
GLOVE SURG NEOP MICRO LF SZ7.5 (GLOVE) ×2 IMPLANT
GLOVE SURG POLYISO LF SZ7 (GLOVE) ×1 IMPLANT
GLOVE SURG SYN 7.5  E (GLOVE) ×4
GLOVE SURG SYN 7.5 E (GLOVE) ×2 IMPLANT
GLOVE SURG SYN 7.5 PF PI (GLOVE) ×2 IMPLANT
GLOVE SURG UNDER POLY LF SZ7 (GLOVE) ×5 IMPLANT
GLOVE SURG UNDER POLY LF SZ7.5 (GLOVE) ×4 IMPLANT
GOWN STRL REIN XL XLG (GOWN DISPOSABLE) ×6 IMPLANT
GOWN STRL REUS W/ TWL LRG LVL3 (GOWN DISPOSABLE) ×2 IMPLANT
GOWN STRL REUS W/ TWL XL LVL3 (GOWN DISPOSABLE) ×2 IMPLANT
GOWN STRL REUS W/TWL LRG LVL3 (GOWN DISPOSABLE)
GOWN STRL REUS W/TWL XL LVL3 (GOWN DISPOSABLE)
K-WIRE FIXATION 2.0X6 (WIRE) ×2
KWIRE FIXATION 2.0X6 (WIRE) IMPLANT
MANIFOLD NEPTUNE II (INSTRUMENTS) ×3 IMPLANT
NDL HYPO 25X1 1.5 SAFETY (NEEDLE) IMPLANT
NEEDLE HYPO 25X1 1.5 SAFETY (NEEDLE) IMPLANT
NS IRRIG 1000ML POUR BTL (IV SOLUTION) ×3 IMPLANT
PACK BASIN DAY SURGERY FS (CUSTOM PROCEDURE TRAY) ×3 IMPLANT
PAD CAST 4YDX4 CTTN HI CHSV (CAST SUPPLIES) ×2 IMPLANT
PADDING CAST ABS 4INX4YD NS (CAST SUPPLIES)
PADDING CAST ABS COTTON 4X4 ST (CAST SUPPLIES) IMPLANT
PADDING CAST COTTON 4X4 STRL (CAST SUPPLIES) ×2
PADDING CAST SYNTHETIC 4 (CAST SUPPLIES) ×3
PADDING CAST SYNTHETIC 4X4 STR (CAST SUPPLIES) IMPLANT
PENCIL SMOKE EVACUATOR (MISCELLANEOUS) ×3 IMPLANT
PLATE OLECRANON SM (Plate) ×1 IMPLANT
SCREW CORT LP 3.5X14 (Screw) ×1 IMPLANT
SCREW CORT LP T15 3.5X16 (Screw) ×1 IMPLANT
SCREW LOCK CORT STAR 3.5X38 (Screw) ×1 IMPLANT
SCREW LP NL T15 3.5X20 (Screw) ×1 IMPLANT
SCREW LP NL T15 3.5X24 (Screw) ×1 IMPLANT
SCREW T15 MD 3.5X20MM NS (Screw) ×1 IMPLANT
SHEET MEDIUM DRAPE 40X70 STRL (DRAPES) ×5 IMPLANT
SLEEVE SCD COMPRESS KNEE MED (STOCKING) ×3 IMPLANT
SLING ARM FOAM STRAP MED (SOFTGOODS) ×1 IMPLANT
SPLINT FIBERGLASS 3X35 (CAST SUPPLIES) ×1 IMPLANT
SPLINT FIBERGLASS 4X30 (CAST SUPPLIES) ×3 IMPLANT
SPONGE T-LAP 18X18 ~~LOC~~+RFID (SPONGE) ×3 IMPLANT
STOCKINETTE IMPERVIOUS LG (DRAPES) ×3 IMPLANT
STRIP CLOSURE SKIN 1/4X4 (GAUZE/BANDAGES/DRESSINGS) IMPLANT
SUCTION FRAZIER HANDLE 10FR (MISCELLANEOUS) ×2
SUCTION TUBE FRAZIER 10FR DISP (MISCELLANEOUS) IMPLANT
SUT ETHILON 3 0 PS 1 (SUTURE) ×6 IMPLANT
SUT MAXBRAID (SUTURE) ×1 IMPLANT
SUT MNCRL AB 4-0 PS2 18 (SUTURE) IMPLANT
SUT VIC AB 0 CT1 27 (SUTURE) ×2
SUT VIC AB 0 CT1 27XBRD ANBCTR (SUTURE) ×2 IMPLANT
SUT VIC AB 2-0 CT1 27 (SUTURE) ×8
SUT VIC AB 2-0 CT1 TAPERPNT 27 (SUTURE) ×2 IMPLANT
SYR BULB EAR ULCER 3OZ GRN STR (SYRINGE) ×3 IMPLANT
SYR CONTROL 10ML LL (SYRINGE) IMPLANT
TOWEL GREEN STERILE FF (TOWEL DISPOSABLE) ×6 IMPLANT
TUBE CONNECTING 20X1/4 (TUBING) ×4 IMPLANT
UNDERPAD 30X36 HEAVY ABSORB (UNDERPADS AND DIAPERS) ×3 IMPLANT
WASHER 3.5MM (Orthopedic Implant) ×1 IMPLANT
YANKAUER SUCT BULB TIP NO VENT (SUCTIONS) ×3 IMPLANT

## 2021-12-19 NOTE — H&P (Signed)
PREOPERATIVE H&P  Chief Complaint: right olecranon fracture  HPI: Tiffany Lopez is a 61 y.o. female who presents for surgical treatment of right olecranon fracture.  She denies any changes in medical history.  Past Medical History:  Diagnosis Date   Allergy    Cancer ([place])    melanoma skin cancer   Olecranon fracture, right, closed, initial encounter    Psoriasis    Past Surgical History:  Procedure Laterality Date   COLONOSCOPY  2017   Dr.Armbruster   DILATION AND CURETTAGE OF UTERUS  12/16/2005   POLYPECTOMY     Social History   Socioeconomic History   Marital status: Married    Spouse name: Not on file   Number of children: Not on file   Years of education: Not on file   Highest education level: Not on file  Occupational History   Not on file  Tobacco Use   Smoking status: Never   Smokeless tobacco: Never  Vaping Use   Vaping Use: Never used  Substance and Sexual Activity   Alcohol use: Not Currently    Comment: rare   Drug use: No   Sexual activity: Yes    Birth control/protection: Post-menopausal  Other Topics Concern   Not on file  Social History Narrative   Not on file   Social Determinants of Health   Financial Resource Strain: Not on file  Food Insecurity: Not on file  Transportation Needs: Not on file  Physical Activity: Not on file  Stress: Not on file  Social Connections: Not on file   Family History  Problem Relation Age of Onset   Colon cancer Paternal Grandmother    Esophageal cancer Neg Hx    Stomach cancer Neg Hx    Rectal cancer Neg Hx    No Known Allergies Prior to Admission medications   Medication Sig Start Date End Date Taking? Authorizing Provider  Calcium-Vitamin D 600-200 MG-UNIT per tablet Take 1 tablet by mouth daily.     Yes [provider]  Cholecalciferol (DIALYVITE VITAMIN D 5000 PO) Take by mouth.   Yes [provider]  FIBER PO Take 2 capsules by mouth.   Yes [provider]   fluocinonide (LIDEX) 0.05 % external solution Apply 1 mL to the scalp twice a day 08/30/21  Yes   hydrocortisone 2.5 % cream APPLY 1 APPLICATION ON THE SKIN TWICE A DAY 03/06/21 03/06/22 Yes Jarome Matin, MD  Omega-3 Fatty Acids (FISH OIL) 1000 MG CAPS Take by mouth.   Yes [provider]     Positive ROS: All other systems have been reviewed and were otherwise negative with the exception of those mentioned in the HPI and as above.  Physical Exam: General: Alert, no acute distress Cardiovascular: No pedal edema Respiratory: No cyanosis, no use of accessory musculature GI: abdomen soft Skin: No lesions in the area of chief complaint Neurologic: Sensation intact distally Psychiatric: Patient is competent for consent with normal mood and affect Lymphatic: no lymphedema  MUSCULOSKELETAL: exam stable  Assessment: right olecranon fracture  Plan: Plan for Procedure(s): OPEN REDUCTION INTERNAL FIXATION (ORIF) ELBOW/OLECRANON FRACTURE  The risks benefits and alternatives were discussed with the patient including but not limited to the risks of nonoperative treatment, versus surgical intervention including infection, bleeding, nerve injury,  blood clots, cardiopulmonary complications, morbidity, mortality, among others, and they were willing to proceed.   Preoperative templating of the joint replacement has been completed, documented, and submitted to the Operating Room personnel  in order to optimize intra-operative equipment management.   Eduard Roux, MD 12/19/2021 10:11 AM

## 2021-12-19 NOTE — Transfer of Care (Signed)
Immediate Anesthesia Transfer of Care Note  Patient: Tiffany Lopez  Procedure(s) Performed: OPEN REDUCTION INTERNAL FIXATION (ORIF) ELBOW/OLECRANON FRACTURE (Right: Elbow)  Patient Location: PACU  Anesthesia Type:General and Regional  Level of Consciousness: drowsy  Airway & Oxygen Therapy: Patient Spontanous Breathing and Patient connected to face mask oxygen  Post-op Assessment: Report given to RN and Post -op Vital signs reviewed and stable  Post vital signs: Reviewed and stable  Last Vitals:  Vitals Value Taken Time  BP    Temp    Pulse 80 12/19/21 1704  Resp 22 12/19/21 1704  SpO2 98 % 12/19/21 1704  Vitals shown include unvalidated device data.  Last Pain:  Vitals:   12/19/21 1032  TempSrc: Oral  PainSc: 1       Patients Stated Pain Goal: 5 (53/74/82 7078)  Complications: No notable events documented.

## 2021-12-19 NOTE — Anesthesia Preprocedure Evaluation (Addendum)
Anesthesia Evaluation  Patient identified by MRN, date of birth, ID band Patient awake    Reviewed: Allergy & Precautions, NPO status , Patient's Chart, lab work & pertinent test results  History of Anesthesia Complications Negative for: history of anesthetic complications  Airway Mallampati: II  TM Distance: >3 FB Neck ROM: Full    Dental  (+) Teeth Intact, Dental Advisory Given   Pulmonary neg pulmonary ROS,    breath sounds clear to auscultation       Cardiovascular negative cardio ROS   Rhythm:Regular Rate:Normal     Neuro/Psych negative neurological ROS     GI/Hepatic negative GI ROS, Neg liver ROS,   Endo/Other  negative endocrine ROS  Renal/GU negative Renal ROS     Musculoskeletal   Abdominal   Peds  Hematology negative hematology ROS (+)   Anesthesia Other Findings   Reproductive/Obstetrics                             Anesthesia Physical Anesthesia Plan  ASA: 1  Anesthesia Plan: General   Post-op Pain Management: Regional block and Tylenol PO (pre-op)   Induction: Intravenous  PONV Risk Score and Plan: 3 and Ondansetron, Dexamethasone and Scopolamine patch - Pre-op  Airway Management Planned: Oral ETT  Additional Equipment: None  Intra-op Plan:   Post-operative Plan: Extubation in OR  Informed Consent: I have reviewed the patients History and Physical, chart, labs and discussed the procedure including the risks, benefits and alternatives for the proposed anesthesia with the patient or authorized representative who has indicated his/her understanding and acceptance.     Dental advisory given  Plan Discussed with: CRNA and Surgeon  Anesthesia Plan Comments: (Plan routine monitors, GETA with supraclav block for post op analgesia)       Anesthesia Quick Evaluation

## 2021-12-19 NOTE — Anesthesia Procedure Notes (Signed)
Anesthesia Regional Block: Supraclavicular block   Pre-Anesthetic Checklist: , timeout performed,  Correct Patient, Correct Site, Correct Laterality,  Correct Procedure, Correct Position, site marked,  Risks and benefits discussed,  Surgical consent,  Pre-op evaluation,  At surgeon's request and post-op pain management  Laterality: Right and Upper  Prep: chloraprep       Needles:  Injection technique: Single-shot  Needle Type: Echogenic Needle     Needle Length: 9cm  Needle Gauge: 21     Additional Needles:   Procedures:,,,, ultrasound used (permanent image in chart),,    Narrative:  Start time: 12/19/2021 12:23 PM End time: 12/19/2021 12:29 PM Injection made incrementally with aspirations every 5 mL.  Performed by: Personally  Anesthesiologist: Annye Asa, MD  Additional Notes: Pt identified in Holding room.  Monitors applied. Working IV access confirmed. Sterile prep R clavicle and neck.  #21ga ECHOgenic Arrow block needle to supraclavicular brachial plexus with US guidance.  5cc 0.5% Bupivacaine with 1:200k epi, Exparel injected incrementally after negative test dose.  Patient asymptomatic, VSS, no heme aspirated, tolerated well.   Jenita Seashore, MD

## 2021-12-19 NOTE — Anesthesia Procedure Notes (Signed)
Procedure Name: Intubation Date/Time: 12/19/2021 2:32 PM Performed by: Maryella Shivers, CRNA Pre-anesthesia Checklist: Patient identified, Emergency Drugs available, Suction available and Patient being monitored Patient Re-evaluated:Patient Re-evaluated prior to induction Oxygen Delivery Method: Circle system utilized Preoxygenation: Pre-oxygenation with 100% oxygen Induction Type: IV induction Ventilation: Mask ventilation without difficulty Laryngoscope Size: Mac and 3 Tube type: Oral Tube size: 7.0 mm Number of attempts: 1 Airway Equipment and Method: Stylet and Oral airway Placement Confirmation: ETT inserted through vocal cords under direct vision, positive ETCO2 and breath sounds checked- equal and bilateral Secured at: 20 cm Tube secured with: Tape Dental Injury: Teeth and Oropharynx as per pre-operative assessment

## 2021-12-19 NOTE — Progress Notes (Signed)
Assisted Dr. Annye Asa with right, ultrasound guided, supraclavicular block. Side rails up, monitors on throughout procedure. See vital signs in flow sheet. Tolerated Procedure well.

## 2021-12-19 NOTE — Discharge Instructions (Addendum)
Postoperative instructions:  Weightbearing instructions: non weight bearing  Dressing instructions: Keep your dressing and/or splint clean and dry at all times.  It will be removed at your first post-operative appointment.  Your stitches and/or staples will be removed at this visit.  Incision instructions:  Do not soak your incision for 3 weeks after surgery.  If the incision gets wet, pat dry and do not scrub the incision.  Pain control:  You have been given a prescription to be taken as directed for post-operative pain control.  In addition, elevate the operative extremity above the heart at all times to prevent swelling and throbbing pain.  Take over-the-counter Colace, 100mg  by mouth twice a day while taking narcotic pain medications to help prevent constipation.  Follow up appointments: 1) 7 days for suture removal and wound check. 2) Dr. Erlinda Hong as scheduled.   -------------------------------------------------------------------------------------------------------------  After Surgery Pain Control:  After your surgery, post-surgical discomfort or pain is likely. This discomfort can last several days to a few weeks. At certain times of the day your discomfort may be more intense.  Did you receive a nerve block?  A nerve block can provide pain relief for one hour to two days after your surgery. As long as the nerve block is working, you will experience little or no sensation in the area the surgeon operated on.  As the nerve block wears off, you will begin to experience pain or discomfort. It is very important that you begin taking your prescribed pain medication before the nerve block fully wears off. Treating your pain at the first sign of the block wearing off will ensure your pain is better controlled and more tolerable when full-sensation returns. Do not wait until the pain is intolerable, as the medicine will be less effective. It is better to treat pain in advance than to try and  catch up.  General Anesthesia:  If you did not receive a nerve block during your surgery, you will need to start taking your pain medication shortly after your surgery and should continue to do so as prescribed by your surgeon.  Pain Medication:  Most commonly we prescribe Vicodin and Percocet for post-operative pain. Both of these medications contain a combination of acetaminophen (Tylenol) and a narcotic to help control pain.   It takes between 30 and 45 minutes before pain medication starts to work. It is important to take your medication before your pain level gets too intense.   Nausea is a common side effect of many pain medications. You will want to eat something before taking your pain medicine to help prevent nausea.   If you are taking a prescription pain medication that contains acetaminophen, we recommend that you do not take additional over the counter acetaminophen (Tylenol).  Other pain relieving options:   Using a cold pack to ice the affected area a few times a day (15 to 20 minutes at a time) can help to relieve pain, reduce swelling and bruising.   Elevation of the affected area can also help to reduce pain and swelling.    Post Anesthesia Home Care Instructions  Activity: Get plenty of rest for the remainder of the day. A responsible individual must stay with you for 24 hours following the procedure.  For the next 24 hours, DO NOT: -Drive a car -Paediatric nurse -Drink alcoholic beverages -Take any medication unless instructed by your physician -Make any legal decisions or sign important papers.  Meals: Start with liquid foods such as gelatin  or soup. Progress to regular foods as tolerated. Avoid greasy, spicy, heavy foods. If nausea and/or vomiting occur, drink only clear liquids until the nausea and/or vomiting subsides. Call your physician if vomiting continues.  Special Instructions/Symptoms: Your throat may feel dry or sore from the anesthesia or the  breathing tube placed in your throat during surgery. If this causes discomfort, gargle with warm salt water. The discomfort should disappear within 24 hours.  If you had a scopolamine patch placed behind your ear for the management of post- operative nausea and/or vomiting:  1. The medication in the patch is effective for 72 hours, after which it should be removed.  Wrap patch in a tissue and discard in the trash. Wash hands thoroughly with soap and water. 2. You may remove the patch earlier than 72 hours if you experience unpleasant side effects which may include dry mouth, dizziness or visual disturbances. 3. Avoid touching the patch. Wash your hands with soap and water after contact with the patch.      Regional Anesthesia Blocks  1. Numbness or the inability to move the "blocked" extremity may last from 3-48 hours after placement. The length of time depends on the medication injected and your individual response to the medication. If the numbness is not going away after 48 hours, call your surgeon.  2. The extremity that is blocked will need to be protected until the numbness is gone and the  Strength has returned. Because you cannot feel it, you will need to take extra care to avoid injury. Because it may be weak, you may have difficulty moving it or using it. You may not know what position it is in without looking at it while the block is in effect.  3. For blocks in the legs and feet, returning to weight bearing and walking needs to be done carefully. You will need to wait until the numbness is entirely gone and the strength has returned. You should be able to move your leg and foot normally before you try and bear weight or walk. You will need someone to be with you when you first try to ensure you do not fall and possibly risk injury.  4. Bruising and tenderness at the needle site are common side effects and will resolve in a few days.  5. Persistent numbness or new problems with movement  should be communicated to the surgeon or the [facility] [phone] [place] [phone]).    Information for Discharge Teaching: EXPAREL (bupivacaine liposome injectable suspension)   Your surgeon or anesthesiologist gave you EXPAREL(bupivacaine) to help control your pain after surgery.  EXPAREL is a local anesthetic that provides pain relief by numbing the tissue around the surgical site. EXPAREL is designed to release pain medication over time and can control pain for up to 72 hours. Depending on how you respond to EXPAREL, you may require less pain medication during your recovery.  Possible side effects: Temporary loss of sensation or ability to move in the area where bupivacaine was injected. Nausea, vomiting, constipation Rarely, numbness and tingling in your mouth or lips, lightheadedness, or anxiety may occur. Call your doctor right away if you think you may be experiencing any of these sensations, or if you have other questions regarding possible side effects.  Follow all other discharge instructions given to you by your surgeon or nurse. Eat a healthy diet and drink plenty of water or other fluids.  If you return to the hospital for any  reason within 96 hours following the administration of EXPAREL, it is important for health care providers to know that you have received this anesthetic. A teal colored band has been placed on your arm with the date, time and amount of EXPAREL you have received in order to alert and inform your health care providers. Please leave this armband in place for the full 96 hours following administration, and then you may remove the band.

## 2021-12-19 NOTE — Op Note (Signed)
Date of Surgery: 12/19/2021  INDICATIONS: Ms. Tiffany Lopez is a 61 y.o.-year-old female with a right olecranon fracture.  The Patient did consent to the procedure after discussion of the risks and benefits.  PREOPERATIVE DIAGNOSIS: Right displaced comminuted olecranon fracture  POSTOPERATIVE DIAGNOSIS: Same.  PROCEDURE: Open reduction internal fixation of right olecranon fracture  SURGEON: N. Eduard Roux, M.D.  ASSIST: Ciro Backer [place], [place]; necessary for the timely completion of procedure and due to complexity of procedure.  ANESTHESIA:  general, regional block  IV FLUIDS AND URINE: See anesthesia.  ESTIMATED BLOOD LOSS: minimal mL.  IMPLANTS: Biomet small olecranon plate  DRAINS: none  COMPLICATIONS: see description of procedure.  DESCRIPTION OF PROCEDURE: The patient was brought to the operating room.  The patient had been signed prior to the procedure and this was documented. The patient had the anesthesia placed by the anesthesiologist.  A time-out was performed to confirm that this was the correct patient, site, side and location. The patient did receive antibiotics prior to the incision and was re-dosed during the procedure as needed at indicated intervals.  A tourniquet was placed.  Patient was placed in the left lateral decubitus position in a beanbag with all bony prominences well-padded.  The patient had the operative extremity prepped and draped in the standard surgical fashion.    A curvilinear posterior incision was made over the elbow.  Full-thickness flaps were raised off of the triceps tendon and the olecranon bursa.  Subcutaneous approach was utilized distally.  The muscle was elevated off of the ulnar shaft.  The fracture was identified and exposed.  Organized hematoma was removed from the fracture site.  The proximal olecranon fracture fragment was quite small with comminution on both the medial and the lateral sides.  I used a #2 max braid suture in a locking Krakw  fashion to pull the triceps with the olecranon fragment down to the proximal ulna.  I was happy with the reduction under fluoroscopy.  The small olecranon plate was then placed on the bony surface and the proximal portion of the plate was contoured down to the bone.  A small portion of the triceps tendon was elevated off of the tip of the olecranon to accommodate the plate.  Provisional fixation was achieved with a K wire.  I then placed 2 bicortical nonlocking screws through the distal holes of the plate into the ulnar shaft.  Each screw had excellent fixation.  I then placed and compression screw across the main fracture fragment into the proximal ulna to gain compression across the fracture.  Again the screw had excellent fixation.  I was then able to placed variable angle locking screws through the provided holes of the proximal portion of the plate to gain additional fixation in the proximal fragment.  I placed 2 more nonlocking bicortical screws through the distal portion of the plate.  Finally I used the #2 max braid suture and tied this down onto the plate as secondary fixation.  Final x-rays were taken.  Gentle range of motion of the elbow did not show any gapping of the fracture.  Tourniquet was deflated.  Hemostasis was obtained.  Surgical site was thoroughly irrigated with normal saline.  Layered closure was performed after placement of half a gram of vancomycin powder.  Sterile dressings were applied.  Long-arm splint with the elbow at 90 degrees was placed.  Patient tolerated procedure well had no immediate complications.  [name] was necessary for opening, closing, retracting, limb positioning and  overall facilitation and timely completion of the procedure.  POSTOPERATIVE PLAN: Patient will be discharged home and follow-up in 1 week for recheck with two-view x-rays of the right elbow out of the splint.  We will keep the sutures in for 2 weeks.  [name], MD 4:40 PM

## 2021-12-20 ENCOUNTER — Encounter (HOSPITAL_BASED_OUTPATIENT_CLINIC_OR_DEPARTMENT_OTHER): Payer: Self-pay | Admitting: Orthopaedic Surgery

## 2021-12-20 NOTE — Anesthesia Postprocedure Evaluation (Signed)
Anesthesia Post Note  Patient: Tiffany Lopez  Procedure(s) Performed: OPEN REDUCTION INTERNAL FIXATION (ORIF) ELBOW/OLECRANON FRACTURE (Right: Elbow)     Patient location during evaluation: PACU Anesthesia Type: General Level of consciousness: awake and alert Pain management: pain level controlled Vital Signs Assessment: post-procedure vital signs reviewed and stable Respiratory status: spontaneous breathing, nonlabored ventilation and respiratory function stable Cardiovascular status: blood pressure returned to baseline and stable Postop Assessment: no apparent nausea or vomiting Anesthetic complications: no   No notable events documented.  Last Vitals:  Vitals:   12/19/21 1730 12/19/21 1815  BP: (!) 112/59 109/61  Pulse: 73 66  Resp: 15 16  Temp:  36.7 C  SpO2: 94% 95%    Last Pain:  Vitals:   12/19/21 1815  TempSrc:   PainSc: 0-No pain   Pain Goal: Patients Stated Pain Goal: 5 (12/19/21 1032)                 Lidia Collum

## 2021-12-28 ENCOUNTER — Encounter: Payer: Self-pay | Admitting: Orthopaedic Surgery

## 2021-12-28 ENCOUNTER — Ambulatory Visit (INDEPENDENT_AMBULATORY_CARE_PROVIDER_SITE_OTHER): Payer: 59

## 2021-12-28 ENCOUNTER — Other Ambulatory Visit: Payer: Self-pay

## 2021-12-28 ENCOUNTER — Ambulatory Visit (INDEPENDENT_AMBULATORY_CARE_PROVIDER_SITE_OTHER): Payer: 59 | Admitting: Orthopaedic Surgery

## 2021-12-28 DIAGNOSIS — S52021A Displaced fracture of olecranon process without intraarticular extension of right ulna, initial encounter for closed fracture: Secondary | ICD-10-CM | POA: Diagnosis not present

## 2021-12-28 NOTE — Progress Notes (Signed)
° °  Post-Op Visit Note   Patient: Tiffany Lopez           Date of Birth: November 15, 1961           MRN: [id number] Visit Date: 12/28/2021 PCP: Haywood Pao, MD   Assessment & Plan:  Chief Complaint:  Chief Complaint  Patient presents with   Right Elbow - Follow-up, Routine Post Op    ORIF olecranon fracture 12/19/2021   Visit Diagnoses:  1. Closed fracture of olecranon process of right ulna, initial encounter     Plan: Tiffany Lopez is 9 days status post ORIF right olecranon fracture.  Overall doing well and reports no pain.  Takes occasional Tylenol.  Right upper extremity shows expected postoperative swelling and bruising.  No neurovascular compromise.  Surgical incision is intact without any signs of infection.  Gentle range of motion is well-tolerated.  The x-rays show stable fixation alignment of the fracture.  We will keep the sutures in for a few more days and she will have them removed at her office.  She will have them place Steri-Strips.  We talked about acceptable activities for the right arm and limitations as well.  Recheck in about 2 to 3 weeks with 2 view x-rays of the right elbow.  Follow-Up Instructions: Return in about 2 weeks (around 01/11/2022).   Orders:  Orders Placed This Encounter  Procedures   XR Elbow 2 Views Right   No orders of the defined types were placed in this encounter.   Imaging: XR Elbow 2 Views Right  Result Date: 12/28/2021 Stable fixation and alignment of olecranon fracture.   PMFS History: Patient Active Problem List   Diagnosis Date Noted   Closed olecranon fracture, right, initial encounter 12/19/2021   Past Medical History:  Diagnosis Date   Allergy    Cancer ([place])    melanoma skin cancer   Olecranon fracture, right, closed, initial encounter    Psoriasis     Family History  Problem Relation Age of Onset   Colon cancer Paternal Grandmother    Esophageal cancer Neg Hx    Stomach cancer Neg Hx    Rectal cancer Neg Hx     Past  Surgical History:  Procedure Laterality Date   COLONOSCOPY  2017   Dr.Armbruster   DILATION AND CURETTAGE OF UTERUS  12/16/2005   ORIF ELBOW FRACTURE Right 12/19/2021   Procedure: OPEN REDUCTION INTERNAL FIXATION (ORIF) ELBOW/OLECRANON FRACTURE;  Surgeon: Leandrew Koyanagi, MD;  Location: [facility];  Service: Orthopedics;  Laterality: Right;   POLYPECTOMY     Social History   Occupational History   Not on file  Tobacco Use   Smoking status: Never   Smokeless tobacco: Never  Vaping Use   Vaping Use: Never used  Substance and Sexual Activity   Alcohol use: Not Currently    Comment: rare   Drug use: No   Sexual activity: Yes    Birth control/protection: Post-menopausal

## 2021-12-31 ENCOUNTER — Telehealth: Payer: Self-pay | Admitting: Orthopaedic Surgery

## 2021-12-31 DIAGNOSIS — E7800 Pure hypercholesterolemia, unspecified: Secondary | ICD-10-CM | POA: Diagnosis not present

## 2021-12-31 DIAGNOSIS — M810 Age-related osteoporosis without current pathological fracture: Secondary | ICD-10-CM | POA: Diagnosis not present

## 2021-12-31 NOTE — Telephone Encounter (Signed)
Pt called stating she saw Dr.Xu on 12/28/21 and was supposed to schedule an appt to have her stitched removed but she didn't. Dr.Xu only had 9:15 on 01/04/22 and pt would like to know if she can have something else opened up. Pt would like a CB.   [phone]

## 2021-12-31 NOTE — Telephone Encounter (Signed)
FYI I spoke with Dr. Erlinda Hong who advised ok for nurse visit at any time that she wants. I called patient who wants to come in Thursday morning at 0830. Added to nurse schedule and advised to call you or triage once patient arrives as I will be in [place].

## 2021-12-31 NOTE — Telephone Encounter (Signed)
Yes she can come in any day of this week she wants.  Thanks.

## 2022-01-03 ENCOUNTER — Ambulatory Visit: Payer: 59

## 2022-01-03 ENCOUNTER — Other Ambulatory Visit: Payer: Self-pay

## 2022-01-03 ENCOUNTER — Telehealth: Payer: Self-pay

## 2022-01-03 NOTE — Telephone Encounter (Signed)
Called patient and apologized as well.

## 2022-01-03 NOTE — Telephone Encounter (Signed)
Patient came in for nurse visit. No one called [place] to let us know she was here on Nurse visit. Waited 45 mins. Finally when they told us she was here- went to get patient and she had already left. I called patient back and I apologized about the wait and told her no one had called to tell us and I was sorry. She said she had to go back to work and she will tell them to take her stitches out at work.

## 2022-01-03 NOTE — Telephone Encounter (Signed)
Thanks for the info.

## 2022-01-04 ENCOUNTER — Encounter: Payer: 59 | Admitting: Orthopaedic Surgery

## 2022-01-04 DIAGNOSIS — M810 Age-related osteoporosis without current pathological fracture: Secondary | ICD-10-CM | POA: Diagnosis not present

## 2022-01-04 DIAGNOSIS — Z1331 Encounter for screening for depression: Secondary | ICD-10-CM | POA: Diagnosis not present

## 2022-01-04 DIAGNOSIS — M722 Plantar fascial fibromatosis: Secondary | ICD-10-CM | POA: Diagnosis not present

## 2022-01-04 DIAGNOSIS — R82998 Other abnormal findings in urine: Secondary | ICD-10-CM | POA: Diagnosis not present

## 2022-01-04 DIAGNOSIS — Z1339 Encounter for screening examination for other mental health and behavioral disorders: Secondary | ICD-10-CM | POA: Diagnosis not present

## 2022-01-04 DIAGNOSIS — K635 Polyp of colon: Secondary | ICD-10-CM | POA: Diagnosis not present

## 2022-01-04 DIAGNOSIS — S52021S Displaced fracture of olecranon process without intraarticular extension of right ulna, sequela: Secondary | ICD-10-CM | POA: Diagnosis not present

## 2022-01-04 DIAGNOSIS — Z0000 Encounter for general adult medical examination without abnormal findings: Secondary | ICD-10-CM | POA: Diagnosis not present

## 2022-01-04 DIAGNOSIS — E7800 Pure hypercholesterolemia, unspecified: Secondary | ICD-10-CM | POA: Diagnosis not present

## 2022-01-15 ENCOUNTER — Ambulatory Visit (INDEPENDENT_AMBULATORY_CARE_PROVIDER_SITE_OTHER): Payer: 59

## 2022-01-15 ENCOUNTER — Other Ambulatory Visit: Payer: Self-pay

## 2022-01-15 ENCOUNTER — Ambulatory Visit (INDEPENDENT_AMBULATORY_CARE_PROVIDER_SITE_OTHER): Payer: 59 | Admitting: Orthopaedic Surgery

## 2022-01-15 ENCOUNTER — Encounter: Payer: Self-pay | Admitting: Orthopaedic Surgery

## 2022-01-15 VITALS — Ht 66.0 in | Wt 121.0 lb

## 2022-01-15 DIAGNOSIS — S52021A Displaced fracture of olecranon process without intraarticular extension of right ulna, initial encounter for closed fracture: Secondary | ICD-10-CM | POA: Diagnosis not present

## 2022-01-15 NOTE — Progress Notes (Signed)
° °  Post-Op Visit Note   Patient: Tiffany Lopez           Date of Birth: [birth year]           MRN: [id number] Visit Date: 01/15/2022 PCP: Haywood Pao, MD   Assessment & Plan:  Chief Complaint:  Chief Complaint  Patient presents with   Right Elbow - Follow-up    ORIF olecranon fracture 12/19/2021   Visit Diagnoses:  1. Closed fracture of olecranon process of right ulna, initial encounter     Plan: Patient is now approximately 4 weeks status post ORIF right olecranon fracture.  She is overall doing well and not taking any pain medications.  She has resumed her usual gym routine and hikes.  Examination of the right elbow shows healed surgical scar.  Steri-Strips removed.  Range of motion is moderately limited due to swelling and surgery.  The x-rays do demonstrate proper fracture healing.  At this point we will send her to PT at our office for range of motion for the next 2 weeks and then she can start strengthening after these initial 2 weeks.  Activity restrictions reviewed.  Follow-up in 2 weeks with two-view x-rays of the right elbow.  Follow-Up Instructions: Return in about 4 weeks (around 02/12/2022).   Orders:  Orders Placed This Encounter  Procedures   XR Elbow 2 Views Right   Ambulatory referral to Physical Therapy   No orders of the defined types were placed in this encounter.   Imaging: XR Elbow 2 Views Right  Result Date: 01/15/2022 Stable fixation of olecranon fracture.  The fracture is demonstrating signs of healing.   PMFS History: Patient Active Problem List   Diagnosis Date Noted   Closed olecranon fracture, right, initial encounter 12/19/2021   Past Medical History:  Diagnosis Date   Allergy    Cancer ([place])    melanoma skin cancer   Olecranon fracture, right, closed, initial encounter    Psoriasis     Family History  Problem Relation Age of Onset   Colon cancer Paternal Grandmother    Esophageal cancer Neg Hx    Stomach cancer Neg Hx     Rectal cancer Neg Hx     Past Surgical History:  Procedure Laterality Date   COLONOSCOPY  2017   Dr.Armbruster   DILATION AND CURETTAGE OF UTERUS  12/16/2005   ORIF ELBOW FRACTURE Right 12/19/2021   Procedure: OPEN REDUCTION INTERNAL FIXATION (ORIF) ELBOW/OLECRANON FRACTURE;  Surgeon: Leandrew Koyanagi, MD;  Location: [facility];  Service: Orthopedics;  Laterality: Right;   POLYPECTOMY     Social History   Occupational History   Not on file  Tobacco Use   Smoking status: Never   Smokeless tobacco: Never  Vaping Use   Vaping Use: Never used  Substance and Sexual Activity   Alcohol use: Not Currently    Comment: rare   Drug use: No   Sexual activity: Yes    Birth control/protection: Post-menopausal

## 2022-01-18 ENCOUNTER — Encounter: Payer: Self-pay | Admitting: Rehabilitative and Restorative Service Providers"

## 2022-01-18 ENCOUNTER — Other Ambulatory Visit: Payer: Self-pay

## 2022-01-18 ENCOUNTER — Ambulatory Visit (INDEPENDENT_AMBULATORY_CARE_PROVIDER_SITE_OTHER): Payer: 59 | Admitting: Rehabilitative and Restorative Service Providers"

## 2022-01-18 DIAGNOSIS — R600 Localized edema: Secondary | ICD-10-CM | POA: Diagnosis not present

## 2022-01-18 DIAGNOSIS — M6281 Muscle weakness (generalized): Secondary | ICD-10-CM | POA: Diagnosis not present

## 2022-01-18 DIAGNOSIS — M25521 Pain in right elbow: Secondary | ICD-10-CM

## 2022-01-18 DIAGNOSIS — M25621 Stiffness of right elbow, not elsewhere classified: Secondary | ICD-10-CM

## 2022-01-18 NOTE — Therapy (Signed)
OUTPATIENT PHYSICAL THERAPY ELBOW EVALUATION   Patient Name: Tiffany Lopez MRN: [id number] DOB:February 08, 1961, 61 y.o., female Today's Date: 01/18/2022   PT End of Session - 01/18/22 1346     Visit Number 1    Number of Visits 24    Date for PT Re-Evaluation 03/15/22    PT Start Time 1300    PT Stop Time 1345    PT Time Calculation (min) 45 min    Activity Tolerance Patient tolerated treatment well;Patient limited by pain    Behavior During Therapy [facility] for tasks assessed/performed             Past Medical History:  Diagnosis Date   Allergy    Cancer ([place])    melanoma skin cancer   Olecranon fracture, right, closed, initial encounter    Psoriasis    Past Surgical History:  Procedure Laterality Date   COLONOSCOPY  2017   Dr.Armbruster   DILATION AND CURETTAGE OF UTERUS  12/16/2005   ORIF ELBOW FRACTURE Right 12/19/2021   Procedure: OPEN REDUCTION INTERNAL FIXATION (ORIF) ELBOW/OLECRANON FRACTURE;  Surgeon: Leandrew Koyanagi, MD;  Location: [place];  Service: Orthopedics;  Laterality: Right;   POLYPECTOMY     Patient Active Problem List   Diagnosis Date Noted   Closed olecranon fracture, right, initial encounter 12/19/2021    PCP: Haywood Pao, MD  REFERRING PROVIDER: Leandrew Koyanagi, MD  REFERRING DIAG: S52.021A (ICD-10-CM) - Closed fracture of olecranon process of right ulna, initial encounter   THERAPY DIAG:  Stiffness of right elbow, not elsewhere classified  Pain in right elbow  Muscle weakness (generalized)  Localized edema   ONSET DATE: December 13, 2021 fall, surgery 12/19/2021  SUBJECTIVE:                                                                                                                                                                                      SUBJECTIVE STATEMENT: Tiffany Lopez is interested in getting as much motion back as possible to be able to brush her teeth and wash her hair.  She fell on her R elbow 12/13/2021  with surgery 12/19/2021 (olecranon fracture).    PERTINENT HISTORY: NA  PAIN:  Are you having pain? No NPRS scale: 0/10 Pain location: R elbow Pain orientation: Right  PAIN TYPE: aching Pain description: intermittent  Aggravating factors: WB, extreme end range Relieving factors: NA  PRECAUTIONS: Other: OK for light strengthening but avoid WB and focus on AROM for the first 4+ weeks.  WEIGHT BEARING RESTRICTIONS Yes R UE  FALLS:  Has patient fallen in last 6 months? Yes Number of falls: 1  LIVING ENVIRONMENT: Lives  with: lives with their spouse Lives in: House/apartment  OCCUPATION: Works at [employer] as Scientist, physiological  PLOF: Independent  PATIENT GOALS Be able to move her elbow better to brush her teeth and wash her hair.  OBJECTIVE:   DIAGNOSTIC FINDINGS:  Significant R elbow AROM impairments affecting all R UE function.  PATIENT SURVEYS:  FOTO 44 (Goal 71)  COGNITION:  Overall cognitive status: Within functional limits for tasks assessed     UPPER EXTREMITY AROM/PROM:  A/PROM Right 01/18/2022 Left 01/18/2022  Shoulder flexion    Shoulder extension    Shoulder abduction    Shoulder adduction    Shoulder internal rotation    Shoulder external rotation    Elbow flexion 77 150  Elbow extension -48 -7  Wrist flexion    Wrist extension    Wrist ulnar deviation    Wrist radial deviation    Wrist pronation    Wrist supination    (Blank rows = not tested)  UPPER EXTREMITY MMT: Deferred due to < 1 month post-surgery  TODAY'S TREATMENT:  Access Code: [email or web address] URL: [email or web address] Date: 01/18/2022 Prepared by: Vista Mink  Exercises Elbow AROM Flexion/Extension Forearm in Neutral in Supine - 2-5 x daily - 7 x weekly - 1-3 sets - 10 reps - 10-15 seconds hold Elbow AROM Blocked Extension in Neutral Position - 3-5 x daily - 7 x weekly - 1-2 sets - 10 reps - 10-15 seconds hold Seated Forearm Supination Pronation - 2-3 x daily - 7 x weekly - 1  sets - 10 reps - 10 seconds hold    PATIENT EDUCATION: Education details: Reviewed HEP and discussed the importance of the next 8 weeks for returning AROM. Person educated: Patient Education method: Explanation, Demonstration, Tactile cues, Verbal cues, and Handouts Education comprehension: verbalized understanding, returned demonstration, verbal cues required, tactile cues required, and needs further education   HOME EXERCISE PROGRAM: Access Code: [email or web address] URL: [email or web address] Date: 01/18/2022 Prepared by: Vista Mink  Exercises Elbow AROM Flexion/Extension Forearm in Neutral in Supine - 2-5 x daily - 7 x weekly - 1-3 sets - 10 reps - 10-15 seconds hold Elbow AROM Blocked Extension in Neutral Position - 3-5 x daily - 7 x weekly - 1-2 sets - 10 reps - 10-15 seconds hold Seated Forearm Supination Pronation - 2-3 x daily - 7 x weekly - 1 sets - 10 reps - 10 seconds hold   ASSESSMENT:  CLINICAL IMPRESSION: Patient is a 61 y.o. FM who was seen today for physical therapy evaluation and treatment for Closed fracture of olecranon process of right ulna. Objective impairments include decreased activity tolerance, decreased knowledge of condition, decreased ROM, decreased strength, increased edema, increased fascial restrictions, impaired perceived functional ability, impaired flexibility, impaired UE functional use, and pain. These impairments are limiting patient from cleaning, community activity, occupation, and all R UE ADLs . Tiffany Lopez will benefit from skilled PT to address above impairments and improve overall function.  REHAB POTENTIAL: Good  CLINICAL DECISION MAKING: Stable/uncomplicated  EVALUATION COMPLEXITY: Low   GOALS: Goals reviewed with patient? Yes  SHORT TERM GOALS:  STG Name Target Date Goal status  1 Improve R elbow AROM to -30 to 105 degrees. Baseline: -48 to 77 degrees 02/15/2022 INITIAL  2 [name] will report independence and compliance with her day 1  HEP. Baseline: Started today 02/01/2022 INITIAL   LONG TERM GOALS:   LTG Name Target Date Goal status  1 Improve FOTO to 70. Baseline: 44 03/15/2022 INITIAL  2 [name] will report R elbow  pain consistently < 3/10 on the VAS with ADLs. Baseline: 0/10 at rest and can be 6+/10 with end range stretching 03/15/2022 INITIAL  3 Improve R elbow AROM to -20 to 135 degrees. Baseline: -48 to 77 degrees 03/15/2022 INITIAL  4 [name] will have R grip strength at 90% or better compared to the uninvolved L. Baseline: Deferred due to post-surgical status 03/15/2022 INITIAL  5 [name] will be independent with her long-term HEP at DC. Baseline: Started today 03/15/2022 INITIAL   PLAN: PT FREQUENCY:  2-3X/week  PT DURATION: 8 weeks  PLANNED INTERVENTIONS: Therapeutic exercises, Therapeutic activity, Neuro Muscular re-education, Patient/Family education, Joint mobilization, Cryotherapy, Moist heat, and Manual therapy  PLAN FOR NEXT SESSION: Review HEP.  HEAVY emphasis on elbow flexion and extension AROM.   Farley Ly, PT, MPT 01/18/2022, 4:50 PM

## 2022-01-21 ENCOUNTER — Ambulatory Visit (INDEPENDENT_AMBULATORY_CARE_PROVIDER_SITE_OTHER): Payer: 59 | Admitting: Physical Therapy

## 2022-01-21 ENCOUNTER — Encounter: Payer: Self-pay | Admitting: Physical Therapy

## 2022-01-21 ENCOUNTER — Other Ambulatory Visit: Payer: Self-pay

## 2022-01-21 DIAGNOSIS — M25521 Pain in right elbow: Secondary | ICD-10-CM

## 2022-01-21 DIAGNOSIS — R600 Localized edema: Secondary | ICD-10-CM | POA: Diagnosis not present

## 2022-01-21 DIAGNOSIS — M25621 Stiffness of right elbow, not elsewhere classified: Secondary | ICD-10-CM

## 2022-01-21 DIAGNOSIS — M6281 Muscle weakness (generalized): Secondary | ICD-10-CM

## 2022-01-21 NOTE — Therapy (Signed)
OUTPATIENT PHYSICAL THERAPY TREATMENT NOTE   Patient Name: Tiffany Lopez MRN: [id number] DOB:1961-03-06, 61 y.o., female Today's Date: 01/21/2022  PCP: Tiffany Pao, MD REFERRING PROVIDER: Leandrew Koyanagi, MD    PT End of Session - 01/21/22 0840     Visit Number 2    Number of Visits 24    Date for PT Re-Evaluation 03/15/22    PT Start Time 0800    PT Stop Time 0838    PT Time Calculation (min) 38 min    Activity Tolerance Patient tolerated treatment well    Behavior During Therapy [facility] for tasks assessed/performed             Past Medical History:  Diagnosis Date   Allergy    Cancer ([place])    melanoma skin cancer   Olecranon fracture, right, closed, initial encounter    Psoriasis    Past Surgical History:  Procedure Laterality Date   COLONOSCOPY  2017   Dr.Armbruster   DILATION AND CURETTAGE OF UTERUS  12/16/2005   ORIF ELBOW FRACTURE Right 12/19/2021   Procedure: OPEN REDUCTION INTERNAL FIXATION (ORIF) ELBOW/OLECRANON FRACTURE;  Surgeon: Tiffany Koyanagi, MD;  Location: [place];  Service: Orthopedics;  Laterality: Right;   POLYPECTOMY     Patient Active Problem List   Diagnosis Date Noted   Closed olecranon fracture, right, initial encounter 12/19/2021    REFERRING DIAG: H47.654Y (ICD-10-CM) - Closed fracture of olecranon process of right ulna, initial encounter   THERAPY DIAG:  Stiffness of right elbow, not elsewhere classified  Pain in right elbow  Muscle weakness (generalized)  Localized edema  PERTINENT HISTORY: NA   PRECAUTIONS: Other: OK for light strengthening but avoid WB and focus on AROM for the first 4+ weeks.   SUBJECTIVE: elbow is slowly progressing, feels exercises are going well. Had husband help with PROM over the weekend  PAIN:  Are you having pain? No     OBJECTIVE:     PATIENT SURVEYS:  01/18/22: FOTO 44 (Goal 71)                                 UPPER EXTREMITY AROM/PROM:   A/PROM Right 01/18/2022  Left 01/18/2022 Right 01/21/22  Shoulder flexion       Shoulder extension       Shoulder abduction       Shoulder adduction       Shoulder internal rotation       Shoulder external rotation       Elbow flexion 77 150 P 91  Elbow extension -48 -7 P -22  Wrist flexion       Wrist extension       Wrist ulnar deviation       Wrist radial deviation       Wrist pronation       Wrist supination       (Blank rows = not tested)   UPPER EXTREMITY MMT: Deferred due to < 1 month post-surgery   TODAY'S TREATMENT:  01/21/22: Reviewed HEP with min cues to decrease substitution LLLD flexion/extension holds 2x2 min each with 2# weight Manual: STM to Rt biceps, PROM to Rt elbow flexion/extension Therex: Standing bicep stretch x 30 sec; holding 2# weight for passive elbow extension stretch  01/18/22 Exercises Elbow AROM Flexion/Extension Forearm in Neutral in Supine - 2-5 x daily - 7 x weekly - 1-3 sets - 10 reps -  10-15 seconds hold Elbow AROM Blocked Extension in Neutral Position - 3-5 x daily - 7 x weekly - 1-2 sets - 10 reps - 10-15 seconds hold Seated Forearm Supination Pronation - 2-3 x daily - 7 x weekly - 1 sets - 10 reps - 10 seconds hold       PATIENT EDUCATION: Education details: added LLLD stretch to HEP Person educated: Patient Education method: Explanation, Demonstration, Tactile cues, Verbal cues, and Handouts Education comprehension: verbalized understanding, returned demonstration, verbal cues required, tactile cues required, and needs further education     HOME EXERCISE PROGRAM: Access Code: [email or web address] URL: [email or web address] Date: 01/21/2022 Prepared by: Tiffany Lopez  Exercises Elbow AROM Flexion/Extension Forearm in Neutral in Supine - 2-5 x daily - 7 x weekly - 1-3 sets - 10 reps - 10-15 seconds hold Elbow AROM Blocked Extension in Neutral Position - 3-5 x daily - 7 x weekly - 1-2 sets - 10 reps - 10-15 seconds hold Seated Forearm Supination  Pronation - 2-3 x daily - 7 x weekly - 1 sets - 10 reps - 10 seconds hold Bicep Stretch at Table - 2-3 x daily - 7 x weekly - 1 sets - 1-2 reps - 20-30 sec hold Supine Elbow Flexion Extension AROM - 2-3 x daily - 7 x weekly - 1-2 sets - 1-2 reps - 2-5 min hold    ASSESSMENT:   CLINICAL IMPRESSION: Pt with 39 degree arc of motion improvement noted today with PROM and is steadily demonstrating improvement in ROM.  Will continue to benefit from PT to maximize function.  Patient is a 61 y.o. FM who was seen today for physical therapy evaluation and treatment for Closed fracture of olecranon process of right ulna. Objective impairments include decreased activity tolerance, decreased knowledge of condition, decreased ROM, decreased strength, increased edema, increased fascial restrictions, impaired perceived functional ability, impaired flexibility, impaired UE functional use, and pain. These impairments are limiting patient from cleaning, community activity, occupation, and all R UE ADLs . Tiffany Lopez will benefit from skilled PT to address above impairments and improve overall function.   REHAB POTENTIAL: Good   CLINICAL DECISION MAKING: Stable/uncomplicated   EVALUATION COMPLEXITY: Low     GOALS: Goals reviewed with patient? Yes   SHORT TERM GOALS:   STG Name Target Date Goal status  1 Improve R elbow AROM to -30 to 105 degrees. Baseline: -48 to 77 degrees 02/15/2022 INITIAL  2 Tiffany Lopez will report independence and compliance with her day 1 HEP. Baseline: Started today 02/01/2022 INITIAL    LONG TERM GOALS:    LTG Name Target Date Goal status  1 Improve FOTO to 29. Baseline: 44 03/15/2022 INITIAL  2 Tiffany Lopez will report R elbow pain consistently < 3/10 on the VAS with ADLs. Baseline: 0/10 at rest and can be 6+/10 with end range stretching 03/15/2022 INITIAL  3 Improve R elbow AROM to -20 to 135 degrees. Baseline: -48 to 77 degrees 03/15/2022 INITIAL  4 Tiffany Lopez will have R grip strength at 90% or better  compared to the uninvolved L. Baseline: Deferred due to post-surgical status 03/15/2022 INITIAL  5 Tiffany Lopez will be independent with her long-term HEP at DC. Baseline: Started today 03/15/2022 INITIAL    PLAN: PT FREQUENCY:  2-3X/week   PT DURATION: 8 weeks   PLANNED INTERVENTIONS: Therapeutic exercises, Therapeutic activity, Neuro Muscular re-education, Patient/Family education, Joint mobilization, Cryotherapy, Moist heat, and Manual therapy   PLAN FOR NEXT SESSION:  Review HEP PRN, continue working on ROM as able  Laureen Abrahams, PT, DPT 01/21/22 8:41 AM

## 2022-01-24 ENCOUNTER — Telehealth: Payer: Self-pay | Admitting: Physical Therapy

## 2022-01-24 ENCOUNTER — Other Ambulatory Visit: Payer: Self-pay

## 2022-01-24 ENCOUNTER — Ambulatory Visit: Payer: 59 | Admitting: Physical Therapy

## 2022-01-24 ENCOUNTER — Encounter: Payer: Self-pay | Admitting: Physical Therapy

## 2022-01-24 DIAGNOSIS — M25521 Pain in right elbow: Secondary | ICD-10-CM | POA: Diagnosis not present

## 2022-01-24 DIAGNOSIS — R600 Localized edema: Secondary | ICD-10-CM

## 2022-01-24 DIAGNOSIS — M25621 Stiffness of right elbow, not elsewhere classified: Secondary | ICD-10-CM

## 2022-01-24 DIAGNOSIS — M6281 Muscle weakness (generalized): Secondary | ICD-10-CM

## 2022-01-24 NOTE — Therapy (Signed)
OUTPATIENT PHYSICAL THERAPY TREATMENT NOTE   Patient Name: Tiffany Lopez MRN: [id number] DOB:1961/11/02, 61 y.o., female Today's Date: 01/24/2022  PCP: Tiffany Pao, MD REFERRING PROVIDER: Leandrew Koyanagi, MD    PT End of Session - 01/24/22 1236     Visit Number 3    Number of Visits 24    Date for PT Re-Evaluation 03/15/22    PT Start Time 1138    PT Stop Time 1219    PT Time Calculation (min) 41 min    Activity Tolerance Patient tolerated treatment well    Behavior During Therapy [facility] for tasks assessed/performed              Past Medical History:  Diagnosis Date   Allergy    Cancer ([place])    melanoma skin cancer   Olecranon fracture, right, closed, initial encounter    Psoriasis    Past Surgical History:  Procedure Laterality Date   COLONOSCOPY  2017   Dr.Armbruster   DILATION AND CURETTAGE OF UTERUS  12/16/2005   ORIF ELBOW FRACTURE Right 12/19/2021   Procedure: OPEN REDUCTION INTERNAL FIXATION (ORIF) ELBOW/OLECRANON FRACTURE;  Surgeon: Tiffany Koyanagi, MD;  Location: [place];  Service: Orthopedics;  Laterality: Right;   POLYPECTOMY     Patient Active Problem List   Diagnosis Date Noted   Closed olecranon fracture, right, initial encounter 12/19/2021    REFERRING DIAG: R15.400Q (ICD-10-CM) - Closed fracture of olecranon process of right ulna, initial encounter   THERAPY DIAG:  Stiffness of right elbow, not elsewhere classified  Pain in right elbow  Muscle weakness (generalized)  Localized edema  PERTINENT HISTORY: NA   PRECAUTIONS: Other: OK for light strengthening but avoid WB and focus on AROM for the first 4+ weeks.   SUBJECTIVE: working on motion, feels like she is able to get a little further range with ADLs   PAIN:  Are you having pain? Yes; 3/10 Rt elbow; c/o aching and sore Alleviating: rest / Aggravating: end range stretches     OBJECTIVE:     PATIENT SURVEYS:  01/18/22: FOTO 44 (Goal 71)                                  UPPER EXTREMITY AROM/PROM:   A/PROM Right 01/18/2022 Left 01/18/2022 Right 01/21/22 Right  01/24/22  Shoulder flexion        Shoulder extension        Shoulder abduction        Shoulder adduction        Shoulder internal rotation        Shoulder external rotation        Elbow flexion 77 150 P 91 P 99  Elbow extension -48 -7 P -22 P -20  Wrist flexion        Wrist extension        Wrist ulnar deviation        Wrist radial deviation        Wrist pronation        Wrist supination        (Blank rows = not tested)   UPPER EXTREMITY MMT: Deferred due to < 1 month post-surgery   TODAY'S TREATMENT:  01/24/22 Manual:  STM to Rt biceps, PROM to Rt elbow flexion/extension; contract/relax for elbow flexion Recommend trial of DN if MD allows - will reach out for clarification TherEx:  Supine in 90 deg  shoulder flexion: passive elbow flexion with 1-2# weights - adding in active extension between holds x 5 reps   01/21/22: Reviewed HEP with min cues to decrease substitution LLLD flexion/extension holds 2x2 min each with 2# weight Manual: STM to Rt biceps, PROM to Rt elbow flexion/extension Therex: Standing bicep stretch x 30 sec; holding 2# weight for passive elbow extension stretch  01/18/22 Exercises Elbow AROM Flexion/Extension Forearm in Neutral in Supine - 2-5 x daily - 7 x weekly - 1-3 sets - 10 reps - 10-15 seconds hold Elbow AROM Blocked Extension in Neutral Position - 3-5 x daily - 7 x weekly - 1-2 sets - 10 reps - 10-15 seconds hold Seated Forearm Supination Pronation - 2-3 x daily - 7 x weekly - 1 sets - 10 reps - 10 seconds hold       PATIENT EDUCATION: Education details: STM with TPR to biceps, DN Person educated: Patient Education method: Explanation, Demonstration, Tactile cues, Verbal cues, and Handouts Education comprehension: verbalized understanding, returned demonstration, verbal cues required, tactile cues required, and needs further education     HOME  EXERCISE PROGRAM: Access Code: [email or web address] URL: [email or web address] Date: 01/21/2022 Prepared by: Tiffany Lopez  Exercises Elbow AROM Flexion/Extension Forearm in Neutral in Supine - 2-5 x daily - 7 x weekly - 1-3 sets - 10 reps - 10-15 seconds hold Elbow AROM Blocked Extension in Neutral Position - 3-5 x daily - 7 x weekly - 1-2 sets - 10 reps - 10-15 seconds hold Seated Forearm Supination Pronation - 2-3 x daily - 7 x weekly - 1 sets - 10 reps - 10 seconds hold Bicep Stretch at Table - 2-3 x daily - 7 x weekly - 1 sets - 1-2 reps - 20-30 sec hold Supine Elbow Flexion Extension AROM - 2-3 x daily - 7 x weekly - 1-2 sets - 1-2 reps - 2-5 min hold  Education Dry Needling    ASSESSMENT:   CLINICAL IMPRESSION: Small PROM improvements noted again today and pt continues to work on ROM at home.  She has active trigger points in bicep and feel she will benefit from DN, but will reach out to MD before trial of DN.  Will continue to benefit from PT to maximize function.  Ojective impairments include decreased activity tolerance, decreased knowledge of condition, decreased ROM, decreased strength, increased edema, increased fascial restrictions, impaired perceived functional ability, impaired flexibility, impaired UE functional use, and pain. These impairments are limiting patient from cleaning, community activity, occupation, and all R UE ADLs . Tiffany Lopez will benefit from skilled PT to address above impairments and improve overall function.   REHAB POTENTIAL: Good   CLINICAL DECISION MAKING: Stable/uncomplicated   EVALUATION COMPLEXITY: Low     GOALS: Goals reviewed with patient? Yes   SHORT TERM GOALS:   STG Name Target Date Goal status  1 Improve R elbow AROM to -30 to 105 degrees. Baseline: -48 to 77 degrees 02/15/2022 INITIAL  2 Tiffany Lopez will report independence and compliance with her day 1 HEP. Baseline: Started today 02/01/2022 MET    LONG TERM GOALS:    LTG Name Target  Date Goal status  1 Improve FOTO to 60. Baseline: 44 03/15/2022 INITIAL  2 Tiffany Lopez will report R elbow pain consistently < 3/10 on the VAS with ADLs. Baseline: 0/10 at rest and can be 6+/10 with end range stretching 03/15/2022 INITIAL  3 Improve R elbow AROM to -20 to 135 degrees. Baseline: -48 to 77 degrees 03/15/2022 INITIAL  4 Tiffany Lopez will  have R grip strength at 90% or better compared to the uninvolved L. Baseline: Deferred due to post-surgical status 03/15/2022 INITIAL  5 Tiffany Lopez will be independent with her long-term HEP at DC. Baseline: Started today 03/15/2022 INITIAL    PLAN: PT FREQUENCY:  2-3X/week   PT DURATION: 8 weeks   PLANNED INTERVENTIONS: Therapeutic exercises, Therapeutic activity, Neuro Muscular re-education, Patient/Family education, Joint mobilization, Cryotherapy, Moist heat, and Manual therapy   PLAN FOR NEXT SESSION:  DN if MD allows, continue with ROM as able, light strengthening     Laureen Abrahams, PT, DPT 01/24/22 12:37 PM

## 2022-01-24 NOTE — Telephone Encounter (Signed)
Dr. Erlinda Hong- I think Tiffany Lopez will benefit from some dry needling to her Rt UE (biceps, triceps specifically).  Her surgery was 12/19/21.  Would you be okay with [place] proceeding with this, or wait until she's further out from surgery?  Thanks- Laureen Abrahams, PT, DPT 01/24/22 12:40 PM

## 2022-01-24 NOTE — Telephone Encounter (Signed)
Yes that's fine. Thank you

## 2022-01-29 ENCOUNTER — Other Ambulatory Visit: Payer: Self-pay

## 2022-01-29 ENCOUNTER — Ambulatory Visit: Payer: 59 | Admitting: Physical Therapy

## 2022-01-29 ENCOUNTER — Encounter: Payer: Self-pay | Admitting: Physical Therapy

## 2022-01-29 DIAGNOSIS — M25621 Stiffness of right elbow, not elsewhere classified: Secondary | ICD-10-CM | POA: Diagnosis not present

## 2022-01-29 DIAGNOSIS — M25521 Pain in right elbow: Secondary | ICD-10-CM

## 2022-01-29 NOTE — Therapy (Signed)
OUTPATIENT PHYSICAL THERAPY TREATMENT NOTE   Patient Name: Tiffany Lopez MRN: [id number] DOB:[birth year], 61 y.o., female Today's Date: 01/29/2022  PCP: [place] Pao, MD REFERRING PROVIDER: Leandrew Koyanagi, MD    PT End of Session - 01/29/22 [id number]     Visit Number 4    Number of Visits 24    Date for PT Re-Evaluation 03/15/22    PT Start Time 0845    PT Stop Time 0923    PT Time Calculation (min) 38 min    Activity Tolerance Patient tolerated treatment well    Behavior During Therapy [facility] for tasks assessed/performed               Past Medical History:  Diagnosis Date   Allergy    Cancer ([place])    melanoma skin cancer   Olecranon fracture, right, closed, initial encounter    Psoriasis    Past Surgical History:  Procedure Laterality Date   COLONOSCOPY  2017   Dr.Armbruster   DILATION AND CURETTAGE OF UTERUS  12/16/2005   ORIF ELBOW FRACTURE Right 12/19/2021   Procedure: OPEN REDUCTION INTERNAL FIXATION (ORIF) ELBOW/OLECRANON FRACTURE;  Surgeon: Leandrew Koyanagi, MD;  Location: [place];  Service: Orthopedics;  Laterality: Right;   POLYPECTOMY     Patient Active Problem List   Diagnosis Date Noted   Closed olecranon fracture, right, initial encounter 12/19/2021    REFERRING DIAG: E26.834H (ICD-10-CM) - Closed fracture of olecranon process of right ulna, initial encounter   THERAPY DIAG:  Stiffness of right elbow, not elsewhere classified  Pain in right elbow  PERTINENT HISTORY: NA   PRECAUTIONS: Other: OK for light strengthening but avoid WB and focus on AROM for the first 4+ weeks.   SUBJECTIVE: willing to try DN today    PAIN:  Are you having pain? Yes; 1-2; up to 8-9/10 Rt elbow; c/o aching and sore Alleviating: rest / Aggravating: end range stretches     OBJECTIVE:     PATIENT SURVEYS:  01/18/22: FOTO 44 (Goal 71)                                 UPPER EXTREMITY AROM/PROM:   A/PROM Right 01/18/2022 Left 01/18/2022  Right 01/21/22 Right  01/24/22  Shoulder flexion        Shoulder extension        Shoulder abduction        Shoulder adduction        Shoulder internal rotation        Shoulder external rotation        Elbow flexion 77 150 P 91 P 99  Elbow extension -48 -7 P -22 P -20  Wrist flexion        Wrist extension        Wrist ulnar deviation        Wrist radial deviation        Wrist pronation        Wrist supination        (Blank rows = not tested)   UPPER EXTREMITY MMT: Deferred due to < 1 month post-surgery   TODAY'S TREATMENT:  01/29/22 Therex: Discussed LLLD stretching and options like dynasplint and JAS.  At this time plan to try LLLD stretch with stretch strap anchored in elbow flexion and extension - holds up to 30 min at home. Manual: STM with compression to Rt biceps; PROM to Rt elbow,  skilled palpation and monitoring of soft tissue during DN Trigger Point Dry-Needling  Treatment instructions: Expect mild to moderate muscle soreness. S/S of pneumothorax if dry needled over a lung field, and to seek immediate medical attention should they occur. Patient verbalized understanding of these instructions and education.  Patient Consent Given: Yes Education handout provided: Yes Muscles treated: Rt biceps Electrical stimulation performed: Yes Parameters:  to tolerance x 5 min Treatment response/outcome: Twitch response elicited   5/0/35 Manual:  STM to Rt biceps, PROM to Rt elbow flexion/extension; contract/relax for elbow flexion Recommend trial of DN if MD allows - will reach out for clarification TherEx:  Supine in 90 deg shoulder flexion: passive elbow flexion with 1-2# weights - adding in active extension between holds x 5 reps   01/21/22: Reviewed HEP with min cues to decrease substitution LLLD flexion/extension holds 2x2 min each with 2# weight Manual: STM to Rt biceps, PROM to Rt elbow flexion/extension Therex: Standing bicep stretch x 30 sec; holding 2# weight for passive  elbow extension stretch       PATIENT EDUCATION: Education details: STM with TPR to biceps, DN Person educated: Patient Education method: Explanation, Demonstration, Tactile cues, Verbal cues, and Handouts Education comprehension: verbalized understanding, returned demonstration, verbal cues required, tactile cues required, and needs further education     HOME EXERCISE PROGRAM: Access Code: [email or web address] URL: [email or web address] Date: 01/21/2022 Prepared by: Faustino Congress  Exercises Elbow AROM Flexion/Extension Forearm in Neutral in Supine - 2-5 x daily - 7 x weekly - 1-3 sets - 10 reps - 10-15 seconds hold Elbow AROM Blocked Extension in Neutral Position - 3-5 x daily - 7 x weekly - 1-2 sets - 10 reps - 10-15 seconds hold Seated Forearm Supination Pronation - 2-3 x daily - 7 x weekly - 1 sets - 10 reps - 10 seconds hold Bicep Stretch at Table - 2-3 x daily - 7 x weekly - 1 sets - 1-2 reps - 20-30 sec hold Supine Elbow Flexion Extension AROM - 2-3 x daily - 7 x weekly - 1-2 sets - 1-2 reps - 2-5 min hold  Education Dry Needling    ASSESSMENT:   CLINICAL IMPRESSION: Trial of DN today to see if that is helpful with increasing ROM and decreasing tightness.  Will continue to benefit from PT to maximize function.  Pt to work on setting up LLLD stretch at home and may consider bracing if needed.  Ojective impairments include decreased activity tolerance, decreased knowledge of condition, decreased ROM, decreased strength, increased edema, increased fascial restrictions, impaired perceived functional ability, impaired flexibility, impaired UE functional use, and pain. These impairments are limiting patient from cleaning, community activity, occupation, and all R UE ADLs . Tiffany Lopez will benefit from skilled PT to address above impairments and improve overall function.   REHAB POTENTIAL: Good   CLINICAL DECISION MAKING: Stable/uncomplicated   EVALUATION COMPLEXITY: Low      GOALS: Goals reviewed with patient? Yes   SHORT TERM GOALS:   STG Name Target Date Goal status  1 Improve R elbow AROM to -30 to 105 degrees. Baseline: -48 to 77 degrees 02/15/2022 INITIAL  2 [name] will report independence and compliance with her day 1 HEP. Baseline: Started today 02/01/2022 MET    LONG TERM GOALS:    LTG Name Target Date Goal status  1 Improve FOTO to 31. Baseline: 44 03/15/2022 INITIAL  2 [name] will report R elbow pain consistently < 3/10 on the VAS with ADLs. Baseline: 0/10 at rest and  can be 6+/10 with end range stretching 03/15/2022 INITIAL  3 Improve R elbow AROM to -20 to 135 degrees. Baseline: -48 to 77 degrees 03/15/2022 INITIAL  4 [name] will have R grip strength at 90% or better compared to the uninvolved L. Baseline: Deferred due to post-surgical status 03/15/2022 INITIAL  5 [name] will be independent with her long-term HEP at [place]. Baseline: Started today 03/15/2022 INITIAL    PLAN: PT FREQUENCY:  2-3X/week   PT DURATION: 8 weeks   PLANNED INTERVENTIONS: Therapeutic exercises, Therapeutic activity, Neuro Muscular re-education, Patient/Family education, Joint mobilization, Cryotherapy, Moist heat, and Manual therapy   PLAN FOR NEXT SESSION:  Assess response to DN, manual for ROM, light strengthening when able, get measurements     Laureen Abrahams, PT, DPT 01/29/22 9:30 AM

## 2022-01-31 ENCOUNTER — Ambulatory Visit: Payer: 59 | Admitting: Rehabilitative and Restorative Service Providers"

## 2022-01-31 ENCOUNTER — Other Ambulatory Visit: Payer: Self-pay

## 2022-01-31 ENCOUNTER — Encounter: Payer: Self-pay | Admitting: Rehabilitative and Restorative Service Providers"

## 2022-01-31 DIAGNOSIS — M6281 Muscle weakness (generalized): Secondary | ICD-10-CM

## 2022-01-31 DIAGNOSIS — M25521 Pain in right elbow: Secondary | ICD-10-CM | POA: Diagnosis not present

## 2022-01-31 DIAGNOSIS — M25621 Stiffness of right elbow, not elsewhere classified: Secondary | ICD-10-CM

## 2022-01-31 DIAGNOSIS — R600 Localized edema: Secondary | ICD-10-CM

## 2022-01-31 NOTE — Therapy (Signed)
OUTPATIENT PHYSICAL THERAPY TREATMENT NOTE   Patient Name: Tiffany Lopez MRN: [id number] DOB:11-13-1961, 61 y.o., female Today's Date: 01/31/2022  PCP: Haywood Pao, MD REFERRING PROVIDER: Leandrew Koyanagi, MD    PT End of Session - 01/31/22 [phone]     Visit Number 5    Number of Visits 24    Date for PT Re-Evaluation 03/15/22    PT Start Time 0846    PT Stop Time 0930    PT Time Calculation (min) 44 min    Activity Tolerance Patient tolerated treatment well    Behavior During Therapy [facility] for tasks assessed/performed                Past Medical History:  Diagnosis Date   Allergy    Cancer ([place])    melanoma skin cancer   Olecranon fracture, right, closed, initial encounter    Psoriasis    Past Surgical History:  Procedure Laterality Date   COLONOSCOPY  2017   Dr.Armbruster   DILATION AND CURETTAGE OF UTERUS  12/16/2005   ORIF ELBOW FRACTURE Right 12/19/2021   Procedure: OPEN REDUCTION INTERNAL FIXATION (ORIF) ELBOW/OLECRANON FRACTURE;  Surgeon: Leandrew Koyanagi, MD;  Location: [place];  Service: Orthopedics;  Laterality: Right;   POLYPECTOMY     Patient Active Problem List   Diagnosis Date Noted   Closed olecranon fracture, right, initial encounter 12/19/2021    REFERRING DIAG: K53.976B (ICD-10-CM) - Closed fracture of olecranon process of right ulna, initial encounter   THERAPY DIAG:  Stiffness of right elbow, not elsewhere classified  Pain in right elbow  Muscle weakness (generalized)  Localized edema  PERTINENT HISTORY: NA   PRECAUTIONS: Other: OK for light strengthening but avoid WB and focus on AROM for the first 4+ weeks.   SUBJECTIVE: Tiffany Lopez reports 1X/day HEP compliance with low load long duration stretching.  3-5X/day compliance recommended.   PAIN:  Are you having pain? Yes; 0/10; up to 6-7/10 Rt elbow; c/o aching and sore Alleviating: rest / Aggravating: end range stretches     OBJECTIVE:     PATIENT SURVEYS:   01/18/22: FOTO 44 (Goal 71)                                 UPPER EXTREMITY AROM/PROM:   A/PROM Right 01/18/2022 Left 01/18/2022 Right 01/21/22 Right  01/24/22 Right 01/31/22  Shoulder flexion         Shoulder extension         Shoulder abduction         Shoulder adduction         Shoulder internal rotation         Shoulder external rotation         Elbow flexion 77 150 P 91 P 99 A 93  Elbow extension -48 -7 P -22 P -20 A -31  Wrist flexion         Wrist extension         Wrist ulnar deviation         Wrist radial deviation         Wrist pronation         Wrist supination         (Blank rows = not tested)   UPPER EXTREMITY MMT: Deferred due to < 1 month post-surgery   TODAY'S TREATMENT:  01/31/2022  Low load long duration stretching elbow flexion and extension (2# extension,  self-overpressure flexion) 2X 3 minutes in each direction.  Active elbow flexion and extension ROM 10X 10 seconds each direction (stretching)  Manual overpressure applied with active supine elbow flexion and extension AAROM 20X 10 seconds each  01/29/22 Therex: Discussed LLLD stretching and options like dynasplint and JAS.  At this time plan to try LLLD stretch with stretch strap anchored in elbow flexion and extension - holds up to 30 min at home. Manual: STM with compression to Rt biceps; PROM to Rt elbow, skilled palpation and monitoring of soft tissue during DN Trigger Point Dry-Needling  Treatment instructions: Expect mild to moderate muscle soreness. S/S of pneumothorax if dry needled over a lung field, and to seek immediate medical attention should they occur. Patient verbalized understanding of these instructions and education.  Patient Consent Given: Yes Education handout provided: Yes Muscles treated: Rt biceps Electrical stimulation performed: Yes Parameters:  to tolerance x 5 min Treatment response/outcome: Twitch response elicited   7/0/17 Manual:  STM to Rt biceps, PROM to Rt elbow  flexion/extension; contract/relax for elbow flexion Recommend trial of DN if MD allows - will reach out for clarification TherEx:  Supine in 90 deg shoulder flexion: passive elbow flexion with 1-2# weights - adding in active extension between holds x 5 reps      PATIENT EDUCATION: Education details: Review HEP (frequency emphasis) Person educated: Patient Education method: Explanation, Demonstration, Tactile cues, Verbal cues, and Handouts Education comprehension: verbalized understanding, returned demonstration, verbal cues required, tactile cues required, and needs further education     HOME EXERCISE PROGRAM: Access Code: [email or web address] URL: [email or web address] Date: 01/21/2022 Prepared by: Faustino Congress  Exercises Elbow AROM Flexion/Extension Forearm in Neutral in Supine - 2-5 x daily - 7 x weekly - 1-3 sets - 10 reps - 10-15 seconds hold Elbow AROM Blocked Extension in Neutral Position - 3-5 x daily - 7 x weekly - 1-2 sets - 10 reps - 10-15 seconds hold Seated Forearm Supination Pronation - 2-3 x daily - 7 x weekly - 1 sets - 10 reps - 10 seconds hold Bicep Stretch at Table - 2-3 x daily - 7 x weekly - 1 sets - 1-2 reps - 20-30 sec hold Supine Elbow Flexion Extension AROM - 2-3 x daily - 7 x weekly - 1-2 sets - 1-2 reps - 2-5 min hold  Emphasis on low load long duration stretching as performed in the office 3-5X/day and active elbow flexion and extension AROM throughout the day     ASSESSMENT:   CLINICAL IMPRESSION: Tiffany Lopez is making good early progress with her elbow AROM.  AROM is the primary emphasis as AROM is difficult to gain > 2-3 months post-injury.  Reinforced the importance of 3-5X/day HEP compliance to meet LTGs.  Continue elbow AROM emphasis.  Ojective impairments include decreased activity tolerance, decreased knowledge of condition, decreased ROM, decreased strength, increased edema, increased fascial restrictions, impaired perceived functional  ability, impaired flexibility, impaired UE functional use, and pain. These impairments are limiting patient from cleaning, community activity, occupation, and all R UE ADLs . Tiffany Lopez will benefit from skilled PT to address above impairments and improve overall function.   REHAB POTENTIAL: Good   CLINICAL DECISION MAKING: Stable/uncomplicated   EVALUATION COMPLEXITY: Low     GOALS: Goals reviewed with patient? Yes   SHORT TERM GOALS:   STG Name Target Date Goal status  1 Improve R elbow AROM to -30 to 105 degrees. Baseline: -48 to 77 degrees 02/15/2022 INITIAL  2 Tiffany Lopez will report independence  and compliance with her day 1 HEP. Baseline: Started today 02/01/2022 MET    LONG TERM GOALS:    LTG Name Target Date Goal status  1 Improve FOTO to 31. Baseline: 44 03/15/2022 INITIAL  2 Tiffany Lopez will report R elbow pain consistently < 3/10 on the VAS with ADLs. Baseline: 0/10 at rest and can be 6+/10 with end range stretching 03/15/2022 INITIAL  3 Improve R elbow AROM to -20 to 135 degrees. Baseline: -48 to 77 degrees 03/15/2022 INITIAL  4 Tiffany Lopez will have R grip strength at 90% or better compared to the uninvolved L. Baseline: Deferred due to post-surgical status 03/15/2022 INITIAL  5 Tiffany Lopez will be independent with her long-term HEP at DC. Baseline: Started today 03/15/2022 INITIAL    PLAN: PT FREQUENCY:  2-3X/week   PT DURATION: 8 weeks   PLANNED INTERVENTIONS: Therapeutic exercises, Therapeutic activity, Neuro Muscular re-education, Patient/Family education, Joint mobilization, Cryotherapy, Moist heat, and Manual therapy   PLAN FOR NEXT SESSION:  Elbow AROM (flexion and extension).   Nathanial Rancher PT, MPT 01/31/22 1:07 PM

## 2022-02-05 ENCOUNTER — Other Ambulatory Visit: Payer: Self-pay

## 2022-02-05 ENCOUNTER — Ambulatory Visit: Payer: 59 | Admitting: Rehabilitative and Restorative Service Providers"

## 2022-02-05 ENCOUNTER — Encounter: Payer: Self-pay | Admitting: Rehabilitative and Restorative Service Providers"

## 2022-02-05 DIAGNOSIS — R600 Localized edema: Secondary | ICD-10-CM | POA: Diagnosis not present

## 2022-02-05 DIAGNOSIS — M6281 Muscle weakness (generalized): Secondary | ICD-10-CM | POA: Diagnosis not present

## 2022-02-05 DIAGNOSIS — M25621 Stiffness of right elbow, not elsewhere classified: Secondary | ICD-10-CM

## 2022-02-05 DIAGNOSIS — M25521 Pain in right elbow: Secondary | ICD-10-CM | POA: Diagnosis not present

## 2022-02-05 NOTE — Therapy (Signed)
OUTPATIENT PHYSICAL THERAPY TREATMENT NOTE   Patient Name: Tiffany Lopez MRN: [id number] DOB:02-07-1961, 61 y.o., female Today's Date: 02/05/2022  PCP: Haywood Pao, MD REFERRING PROVIDER: Leandrew Koyanagi, MD    PT End of Session - 02/05/22 1054     Visit Number 6    Number of Visits 24    Date for PT Re-Evaluation 03/15/22    PT Start Time 1012    PT Stop Time 1053    PT Time Calculation (min) 41 min    Activity Tolerance Patient tolerated treatment well    Behavior During Therapy [facility] for tasks assessed/performed                 Past Medical History:  Diagnosis Date   Allergy    Cancer ([place])    melanoma skin cancer   Olecranon fracture, right, closed, initial encounter    Psoriasis    Past Surgical History:  Procedure Laterality Date   COLONOSCOPY  2017   Dr.Armbruster   DILATION AND CURETTAGE OF UTERUS  12/16/2005   ORIF ELBOW FRACTURE Right 12/19/2021   Procedure: OPEN REDUCTION INTERNAL FIXATION (ORIF) ELBOW/OLECRANON FRACTURE;  Surgeon: Leandrew Koyanagi, MD;  Location: [place];  Service: Orthopedics;  Laterality: Right;   POLYPECTOMY     Patient Active Problem List   Diagnosis Date Noted   Closed olecranon fracture, right, initial encounter 12/19/2021    REFERRING DIAG: D92.426S (ICD-10-CM) - Closed fracture of olecranon process of right ulna, initial encounter   THERAPY DIAG:  Stiffness of right elbow, not elsewhere classified  Pain in right elbow  Muscle weakness (generalized)  Localized edema  PERTINENT HISTORY: NA   PRECAUTIONS: Strengthening allowed after 01/30/2022 per MD referral  SUBJECTIVE: Patient indicated no pain today and minimal symptoms over the last week.  Patient indicated restriction in mobility as chief concern.    PAIN:  Are you having pain? No pain     OBJECTIVE:     PATIENT SURVEYS:  01/18/22: FOTO 44 (Goal 71)                                 UPPER EXTREMITY AROM/PROM:   A/PROM  Right 01/18/2022 Left 01/18/2022 Right 01/21/22 Right  01/24/22 Right 01/31/22 Right 02/05/2022  Shoulder flexion          Shoulder extension          Shoulder abduction          Shoulder adduction          Shoulder internal rotation          Shoulder external rotation          Elbow flexion 77 150 P 91 P 99 A 93 In supine AROM 94   Elbow extension -48 -7 P -22 P -20 A -31 In Supine AROM -25 PROM -22  Wrist flexion          Wrist extension          Wrist ulnar deviation          Wrist radial deviation          Wrist pronation          Wrist supination          (Blank rows = not tested)   UPPER EXTREMITY MMT:    MMT Right 02/05/2022 Left 02/05/2022     Shoulder flexion  Shoulder extension         Shoulder abduction         Shoulder adduction         Shoulder internal rotation         Shoulder external rotation         Elbow flexion 17.5, 17.9 lbs in supine 80 deg flexion 28, 30 lb in supine 80 deg flexion     Elbow extension 9.7, 9.1 lbs in supine 80 deg flexion 16.2, 15.8 lbs in supine 80 deg flexion     Wrist flexion  5/5      Wrist extension  5/5      Wrist ulnar deviation         Wrist radial deviation         Wrist pronation  4/5       Wrist supination  4/5          TODAY'S TREATMENT:  02/05/22 Therex:  Supine elbow long duration low load stretching 3 mins prior to PROM measurement   Alternating isometric flexion/extension Rt elbow 10 sec hold x 9 each way in supine 90 deg flexion submax and painfree   Seated towel grip 10 sec x 1 (cues for home use throughout the day as desired)   Seated pronation/supination c 2 lb weight (hammer type hold) x 20 each direction c arm at side  Manual: G3-G4 Rt elbow mobilizations AP/PA to radiohumeral and ulna/humeral joint  01/31/2022  Low load long duration stretching elbow flexion and extension (2# extension, self-overpressure flexion) 2X 3 minutes in each direction.  Active elbow flexion and extension ROM 10X 10 seconds each  direction (stretching)  Manual overpressure applied with active supine elbow flexion and extension AAROM 20X 10 seconds each  01/29/22 Therex: Discussed LLLD stretching and options like dynasplint and JAS.  At this time plan to try LLLD stretch with stretch strap anchored in elbow flexion and extension - holds up to 30 min at home. Manual: STM with compression to Rt biceps; PROM to Rt elbow, skilled palpation and monitoring of soft tissue during DN Trigger Point Dry-Needling  Treatment instructions: Expect mild to moderate muscle soreness. S/S of pneumothorax if dry needled over a lung field, and to seek immediate medical attention should they occur. Patient verbalized understanding of these instructions and education.  Patient Consent Given: Yes Education handout provided: Yes Muscles treated: Rt biceps Electrical stimulation performed: Yes Parameters:  to tolerance x 5 min Treatment response/outcome: Twitch response elicited   01/19/39 Manual:  STM to Rt biceps, PROM to Rt elbow flexion/extension; contract/relax for elbow flexion Recommend trial of DN if MD allows - will reach out for clarification TherEx:  Supine in 90 deg shoulder flexion: passive elbow flexion with 1-2# weights - adding in active extension between holds x 5 reps      PATIENT EDUCATION: 02/05/2022: Education details: HEP progression (isometric holds, supination/pronation) Person educated: Patient Education method: Explanation, Demonstration, Tactile cues, Verbal cues, and Handouts Education comprehension: verbalized understanding, returned demonstration, verbal cues required, tactile cues required, and needs further education     HOME EXERCISE PROGRAM: Access Code: [email or web address] URL: [email or web address] Date: 02/05/2022 Prepared by: Scot Jun  Exercises Elbow AROM Flexion/Extension Forearm in Neutral in Supine - 2-5 x daily - 7 x weekly - 1-3 sets - 10 reps - 10-15 seconds hold Elbow AROM  Blocked Extension in Neutral Position - 3-5 x daily - 7 x weekly - 1-2 sets - 10 reps - 10-15 seconds hold Seated Forearm  Supination Pronation - 2-3 x daily - 7 x weekly - 1 sets - 10 reps - 10 seconds hold Bicep Stretch at Table - 2-3 x daily - 7 x weekly - 1 sets - 1-2 reps - 20-30 sec hold Supine Elbow Flexion Extension AROM - 2-3 x daily - 7 x weekly - 1-2 sets - 1-2 reps - 2-5 min hold Forearm Pronation and Supination with Hammer (Mirrored) - 1-2 x daily - 7 x weekly - 1-2 sets - 10 reps Seated Isometric Elbow Flexion - 1-2 x daily - 7 x weekly - 1 sets - 5 reps - 10 hold Seated Isometric Elbow Extension - 1-2 x daily - 7 x weekly - 1 sets - 5 reps - 10 hold      ASSESSMENT:   CLINICAL IMPRESSION: Patient presentation of restricted Rt elbow movement in part from inter and intra articular restrictions noted and functional limiting patient from previous level at this time.  Reassessment of mobility/strength noted in objective data today.  Continued skilled PT services indicated at this time.    REHAB POTENTIAL: Good   CLINICAL DECISION MAKING: Stable/uncomplicated   EVALUATION COMPLEXITY: Low     GOALS: Goals reviewed with patient? Yes   SHORT TERM GOALS:   STG Name Target Date Goal status  1 Improve Rt elbow AROM to -30 to 105 degrees.  02/15/2022 On going Assessed 02/05/2022  2 [name] will report independence and compliance with her day 1 HEP.  02/01/2022 MET    LONG TERM GOALS:    LTG Name Target Date Goal status  1 Improve FOTO to 10. Baseline: 44 03/15/2022 On going Assessed 02/05/2022  2 [name] will report Rt elbow pain consistently < 3/10 on the VAS with ADLs.  03/15/2022 On going Assessed 02/05/2022  3 Improve Rt elbow AROM to -20 to 135 degrees.  03/15/2022 On going Assessed 02/05/2022  [place] will have Rt grip strength at 90% or better compared to the uninvolved L.  03/15/2022 On going Assessed 02/05/2022  [place] will be independent with her long-term HEP at DC.   03/15/2022 On going Assessed 02/05/2022    PLAN: PT FREQUENCY:  2-3X/week   PT DURATION: 8 weeks   PLANNED INTERVENTIONS: Therapeutic exercises, Therapeutic activity, Neuro Muscular re-education, Patient/Family education, Joint mobilization, Cryotherapy, Moist heat, and Manual therapy   PLAN FOR NEXT SESSION:  Joint mobilizations Rt elbow, progressive stretching and strengthening.   Scot Jun, PT, DPT, OCS, ATC 02/05/22  10:54 AM

## 2022-02-07 ENCOUNTER — Other Ambulatory Visit: Payer: Self-pay

## 2022-02-07 ENCOUNTER — Encounter: Payer: Self-pay | Admitting: Physical Therapy

## 2022-02-07 ENCOUNTER — Ambulatory Visit: Payer: 59 | Admitting: Physical Therapy

## 2022-02-07 DIAGNOSIS — M25521 Pain in right elbow: Secondary | ICD-10-CM | POA: Diagnosis not present

## 2022-02-07 DIAGNOSIS — M25621 Stiffness of right elbow, not elsewhere classified: Secondary | ICD-10-CM | POA: Diagnosis not present

## 2022-02-07 DIAGNOSIS — M6281 Muscle weakness (generalized): Secondary | ICD-10-CM

## 2022-02-07 DIAGNOSIS — R600 Localized edema: Secondary | ICD-10-CM

## 2022-02-07 NOTE — Therapy (Signed)
OUTPATIENT PHYSICAL THERAPY TREATMENT NOTE   Patient Name: Tiffany Lopez MRN: [id number] DOB:Nov 13, 1961, 61 y.o., female Today's Date: 02/07/2022  PCP: Haywood Pao, MD REFERRING PROVIDER: Leandrew Koyanagi, MD    PT End of Session - 02/07/22 [phone]     Visit Number 7    Number of Visits 24    Date for PT Re-Evaluation 03/15/22    PT Start Time 0845    PT Stop Time 0925    PT Time Calculation (min) 40 min    Activity Tolerance Patient tolerated treatment well    Behavior During Therapy [facility] for tasks assessed/performed                  Past Medical History:  Diagnosis Date   Allergy    Cancer ([place])    melanoma skin cancer   Olecranon fracture, right, closed, initial encounter    Psoriasis    Past Surgical History:  Procedure Laterality Date   COLONOSCOPY  2017   Dr.Armbruster   DILATION AND CURETTAGE OF UTERUS  12/16/2005   ORIF ELBOW FRACTURE Right 12/19/2021   Procedure: OPEN REDUCTION INTERNAL FIXATION (ORIF) ELBOW/OLECRANON FRACTURE;  Surgeon: Leandrew Koyanagi, MD;  Location: [place];  Service: Orthopedics;  Laterality: Right;   POLYPECTOMY     Patient Active Problem List   Diagnosis Date Noted   Closed olecranon fracture, right, initial encounter 12/19/2021    REFERRING DIAG: U76.546T (ICD-10-CM) - Closed fracture of olecranon process of right ulna, initial encounter   THERAPY DIAG:  Stiffness of right elbow, not elsewhere classified  Pain in right elbow  Muscle weakness (generalized)  Localized edema  PERTINENT HISTORY: NA   PRECAUTIONS: Strengthening allowed after 01/30/2022 per MD referral  SUBJECTIVE: feels like flexion will not budge.    PAIN:  Are you having pain? No pain     OBJECTIVE:     PATIENT SURVEYS:  01/18/22: FOTO 44 (Goal 71)                                 UPPER EXTREMITY AROM/PROM:   A/PROM Right 01/18/2022 Left 01/18/2022 Right 01/21/22 Right  01/24/22 Right 01/31/22 Right 02/05/2022 Right 02/07/22   Shoulder flexion           Shoulder extension           Shoulder abduction           Shoulder adduction           Shoulder internal rotation           Shoulder external rotation           Elbow flexion 77 150 P 91 P 99 A 93 In supine AROM 94  Standing  P 100 A 95  Elbow extension -48 -7 P -22 P -20 A -31 In Supine AROM -25 PROM -22   Wrist flexion           Wrist extension           Wrist ulnar deviation           Wrist radial deviation           Wrist pronation           Wrist supination           (Blank rows = not tested)   UPPER EXTREMITY MMT:    MMT Right 02/05/2022 Left 02/05/2022  Shoulder flexion         Shoulder extension         Shoulder abduction         Shoulder adduction         Shoulder internal rotation         Shoulder external rotation         Elbow flexion 17.5, 17.9 lbs in supine 80 deg flexion 28, 30 lb in supine 80 deg flexion     Elbow extension 9.7, 9.1 lbs in supine 80 deg flexion 16.2, 15.8 lbs in supine 80 deg flexion     Wrist flexion  5/5      Wrist extension  5/5      Wrist ulnar deviation         Wrist radial deviation         Wrist pronation  4/5       Wrist supination  4/5          TODAY'S TREATMENT:  02/07/22 Therex:   Bicep Curl Rt 2x10 with 5#, eccentric focus with hold at end [employer] with 2# 2x10 ; eccentric focus with hold at end Seated pronation/supination c 2 lb weight (hammer type hold) x 20 each direction c arm at side  Self Care: Discussed dynamic splinting, will reach out to companies for additional information  Manual: G3-G4 Rt elbow mobilizations AP/PA to radiohumeral and ulna/humeral joint, PROM flexion and extension to Rt elbow  02/05/22 Therex:  Supine elbow long duration low load stretching 3 mins prior to PROM measurement   Alternating isometric flexion/extension Rt elbow 10 sec hold x 9 each way in supine 90 deg flexion submax and painfree   Seated towel grip 10 sec x 1 (cues for home use  throughout the day as desired)   Seated pronation/supination c 2 lb weight (hammer type hold) x 20 each direction c arm at side  Manual: G3-G4 Rt elbow mobilizations AP/PA to radiohumeral and ulna/humeral joint  01/31/2022  Low load long duration stretching elbow flexion and extension (2# extension, self-overpressure flexion) 2X 3 minutes in each direction.  Active elbow flexion and extension ROM 10X 10 seconds each direction (stretching)  Manual overpressure applied with active supine elbow flexion and extension AAROM 20X 10 seconds each  PATIENT EDUCATION: 02/05/2022: Education details: HEP progression (isometric holds, supination/pronation) Person educated: Patient Education method: Explanation, Demonstration, Tactile cues, Verbal cues, and Handouts Education comprehension: verbalized understanding, returned demonstration, verbal cues required, tactile cues required, and needs further education     HOME EXERCISE PROGRAM: Access Code: [email or web address] URL: [email or web address] Date: 02/05/2022 Prepared by: Scot Jun  Exercises Elbow AROM Flexion/Extension Forearm in Neutral in Supine - 2-5 x daily - 7 x weekly - 1-3 sets - 10 reps - 10-15 seconds hold Elbow AROM Blocked Extension in Neutral Position - 3-5 x daily - 7 x weekly - 1-2 sets - 10 reps - 10-15 seconds hold Seated Forearm Supination Pronation - 2-3 x daily - 7 x weekly - 1 sets - 10 reps - 10 seconds hold Bicep Stretch at Table - 2-3 x daily - 7 x weekly - 1 sets - 1-2 reps - 20-30 sec hold Supine Elbow Flexion Extension AROM - 2-3 x daily - 7 x weekly - 1-2 sets - 1-2 reps - 2-5 min hold Forearm Pronation and Supination with Hammer (Mirrored) - 1-2 x daily - 7 x weekly - 1-2 sets - 10 reps Seated Isometric Elbow Flexion - 1-2 x daily - 7  x weekly - 1 sets - 5 reps - 10 hold Seated Isometric Elbow Extension - 1-2 x daily - 7 x weekly - 1 sets - 5 reps - 10 hold      ASSESSMENT:   CLINICAL  IMPRESSION: Pt tolerated session well demonstrating slight increase in passive flexion.  ROM with slow progress at this time.  Did continue with some gentle strengthening.  Pt to see MD next week.   REHAB POTENTIAL: Good   CLINICAL DECISION MAKING: Stable/uncomplicated   EVALUATION COMPLEXITY: Low     GOALS: Goals reviewed with patient? Yes   SHORT TERM GOALS:   STG Name Target Date Goal status  1 Improve Rt elbow AROM to -30 to 105 degrees.  02/15/2022 On going Assessed 02/05/2022  2 [name] will report independence and compliance with her day 1 HEP.  02/01/2022 MET    LONG TERM GOALS:    LTG Name Target Date Goal status  1 Improve FOTO to 41. Baseline: 44 03/15/2022 On going Assessed 02/05/2022  2 [name] will report Rt elbow pain consistently < 3/10 on the VAS with ADLs.  03/15/2022 On going Assessed 02/05/2022  3 Improve Rt elbow AROM to -20 to 135 degrees.  03/15/2022 On going Assessed 02/05/2022  [place] will have Rt grip strength at 90% or better compared to the uninvolved L.  03/15/2022 On going Assessed 02/05/2022  [place] will be independent with her long-term HEP at DC.  03/15/2022 On going Assessed 02/05/2022    PLAN: PT FREQUENCY:  2-3X/week   PT DURATION: 8 weeks   PLANNED INTERVENTIONS: Therapeutic exercises, Therapeutic activity, Neuro Muscular re-education, Patient/Family education, Joint mobilization, Cryotherapy, Moist heat, and Manual therapy   PLAN FOR NEXT SESSION:  Continue manual for ROM, progressive stretching and strengthening.     Laureen Abrahams, PT, DPT 02/07/22 9:28 AM

## 2022-02-12 ENCOUNTER — Other Ambulatory Visit: Payer: Self-pay

## 2022-02-12 ENCOUNTER — Other Ambulatory Visit (HOSPITAL_COMMUNITY): Payer: Self-pay

## 2022-02-12 ENCOUNTER — Encounter: Payer: Self-pay | Admitting: Rehabilitative and Restorative Service Providers"

## 2022-02-12 ENCOUNTER — Encounter: Payer: Self-pay | Admitting: Orthopaedic Surgery

## 2022-02-12 ENCOUNTER — Ambulatory Visit: Payer: 59 | Admitting: Rehabilitative and Restorative Service Providers"

## 2022-02-12 ENCOUNTER — Ambulatory Visit (INDEPENDENT_AMBULATORY_CARE_PROVIDER_SITE_OTHER): Payer: 59

## 2022-02-12 ENCOUNTER — Ambulatory Visit (INDEPENDENT_AMBULATORY_CARE_PROVIDER_SITE_OTHER): Payer: 59 | Admitting: Orthopaedic Surgery

## 2022-02-12 DIAGNOSIS — S52021A Displaced fracture of olecranon process without intraarticular extension of right ulna, initial encounter for closed fracture: Secondary | ICD-10-CM

## 2022-02-12 DIAGNOSIS — M25521 Pain in right elbow: Secondary | ICD-10-CM | POA: Diagnosis not present

## 2022-02-12 DIAGNOSIS — M6281 Muscle weakness (generalized): Secondary | ICD-10-CM

## 2022-02-12 DIAGNOSIS — R600 Localized edema: Secondary | ICD-10-CM

## 2022-02-12 DIAGNOSIS — M25621 Stiffness of right elbow, not elsewhere classified: Secondary | ICD-10-CM

## 2022-02-12 MED ORDER — DICLOFENAC SODIUM 75 MG PO TBEC
75.0000 mg | DELAYED_RELEASE_TABLET | Freq: Two times a day (BID) | ORAL | 2 refills | Status: DC
  Filled 2022-02-12: qty 30, 15d supply, fill #0

## 2022-02-12 NOTE — Therapy (Signed)
OUTPATIENT PHYSICAL THERAPY TREATMENT NOTE   Patient Name: Tiffany Lopez MRN: [id number] DOB:1961-03-21, 61 y.o., female Today's Date: 02/12/2022  PCP: Haywood Pao, MD REFERRING PROVIDER: Leandrew Koyanagi, MD    PT End of Session - 02/12/22 1119     Visit Number 8    Number of Visits 24    Date for PT Re-Evaluation 03/15/22    PT Start Time 1142    PT Stop Time 1221    PT Time Calculation (min) 39 min    Activity Tolerance Patient tolerated treatment well    Behavior During Therapy [facility] for tasks assessed/performed                  Past Medical History:  Diagnosis Date   Allergy    Cancer ([place])    melanoma skin cancer   Olecranon fracture, right, closed, initial encounter    Psoriasis    Past Surgical History:  Procedure Laterality Date   COLONOSCOPY  2017   Dr.Armbruster   DILATION AND CURETTAGE OF UTERUS  12/16/2005   ORIF ELBOW FRACTURE Right 12/19/2021   Procedure: OPEN REDUCTION INTERNAL FIXATION (ORIF) ELBOW/OLECRANON FRACTURE;  Surgeon: Leandrew Koyanagi, MD;  Location: [place];  Service: Orthopedics;  Laterality: Right;   POLYPECTOMY     Patient Active Problem List   Diagnosis Date Noted   Closed olecranon fracture, right, initial encounter 12/19/2021    REFERRING DIAG: V42.595G (ICD-10-CM) - Closed fracture of olecranon process of right ulna, initial encounter   THERAPY DIAG:  Stiffness of right elbow, not elsewhere classified  Pain in right elbow  Muscle weakness (generalized)  Localized edema  PERTINENT HISTORY: NA   PRECAUTIONS: Strengthening allowed after 01/30/2022 per MD referral  SUBJECTIVE: feels like flexion will not budge.    PAIN:  Are you having pain? No pain     OBJECTIVE:     PATIENT SURVEYS:  01/18/22: FOTO 44 (Goal 71)                                 UPPER EXTREMITY AROM/PROM:   A/PROM Right 01/18/2022 Left 01/18/2022 Right 01/21/22 Right  01/24/22 Right 01/31/22 Right 02/05/2022 Right 02/07/22   Shoulder flexion           Shoulder extension           Shoulder abduction           Shoulder adduction           Shoulder internal rotation           Shoulder external rotation           Elbow flexion 77 150 P 91 P 99 A 93 In supine AROM 94  Standing  P 100 A 95  Elbow extension -48 -7 P -22 P -20 A -31 In Supine AROM -25 PROM -22   Wrist flexion           Wrist extension           Wrist ulnar deviation           Wrist radial deviation           Wrist pronation           Wrist supination           (Blank rows = not tested)   UPPER EXTREMITY MMT:    MMT Right 02/05/2022 Left 02/05/2022  Shoulder flexion         Shoulder extension         Shoulder abduction         Shoulder adduction         Shoulder internal rotation         Shoulder external rotation         Elbow flexion 17.5, 17.9 lbs in supine 80 deg flexion 28, 30 lb in supine 80 deg flexion     Elbow extension 9.7, 9.1 lbs in supine 80 deg flexion 16.2, 15.8 lbs in supine 80 deg flexion     Wrist flexion  5/5      Wrist extension  5/5      Wrist ulnar deviation         Wrist radial deviation         Wrist pronation  4/5       Wrist supination  4/5          TODAY'S TREATMENT:  02/12/22 Therex:   Bicep Curl Rt 3x10 with 5#, eccentric focus with hold at end [employer] with 3# 2x10 ; eccentric focus with hold at end Seated pronation/supination c 3 lb weight (hammer type hold) x 15 each direction c arm at side    Manual: G3-G4 Rt elbow mobilizations AP/PA to radiohumeral and ulna/humeral joint, PROM flexion and extension to Rt elbow c contract/relax techniques  02/07/22 Therex:   Bicep Curl Rt 2x10 with 5#, eccentric focus with hold at end [employer] with 2# 2x10 ; eccentric focus with hold at end Seated pronation/supination c 2 lb weight (hammer type hold) x 20 each direction c arm at side    Manual: G3-G4 Rt elbow mobilizations AP/PA to radiohumeral and ulna/humeral joint, PROM  flexion and extension to Rt elbow  02/05/22 Therex:  Supine elbow long duration low load stretching 3 mins prior to PROM measurement   Alternating isometric flexion/extension Rt elbow 10 sec hold x 9 each way in supine 90 deg flexion submax and painfree   Seated towel grip 10 sec x 1 (cues for home use throughout the day as desired)   Seated pronation/supination c 2 lb weight (hammer type hold) x 20 each direction c arm at side  Manual: G3-G4 Rt elbow mobilizations AP/PA to radiohumeral and ulna/humeral joint    PATIENT EDUCATION: 02/05/2022: Education details: HEP progression (isometric holds, supination/pronation) Person educated: Patient Education method: Explanation, Demonstration, Tactile cues, Verbal cues, and Handouts Education comprehension: verbalized understanding, returned demonstration, verbal cues required, tactile cues required, and needs further education     HOME EXERCISE PROGRAM: Access Code: [email or web address] URL: [email or web address] Date: 02/05/2022 Prepared by: Scot Jun  Exercises Elbow AROM Flexion/Extension Forearm in Neutral in Supine - 2-5 x daily - 7 x weekly - 1-3 sets - 10 reps - 10-15 seconds hold Elbow AROM Blocked Extension in Neutral Position - 3-5 x daily - 7 x weekly - 1-2 sets - 10 reps - 10-15 seconds hold Seated Forearm Supination Pronation - 2-3 x daily - 7 x weekly - 1 sets - 10 reps - 10 seconds hold Bicep Stretch at Table - 2-3 x daily - 7 x weekly - 1 sets - 1-2 reps - 20-30 sec hold Supine Elbow Flexion Extension AROM - 2-3 x daily - 7 x weekly - 1-2 sets - 1-2 reps - 2-5 min hold Forearm Pronation and Supination with Hammer (Mirrored) - 1-2 x daily - 7 x weekly - 1-2 sets - 10 reps  Seated Isometric Elbow Flexion - 1-2 x daily - 7 x weekly - 1 sets - 5 reps - 10 hold Seated Isometric Elbow Extension - 1-2 x daily - 7 x weekly - 1 sets - 5 reps - 10 hold      ASSESSMENT:   CLINICAL IMPRESSION: Biceps restriction was more  noted than triceps for end range in addition to capsular tightness restrictions.  Pt to benefit from continued skilled PT services c mobility improvement focus c manual and interventions.   REHAB POTENTIAL: Good   CLINICAL DECISION MAKING: Stable/uncomplicated   EVALUATION COMPLEXITY: Low     GOALS: Goals reviewed with patient? Yes   SHORT TERM GOALS:   STG Name Target Date Goal status  1 Improve Rt elbow AROM to -30 to 105 degrees.  02/15/2022 On going Assessed 02/05/2022  2 [name] will report independence and compliance with her day 1 HEP.  02/01/2022 MET    LONG TERM GOALS:    LTG Name Target Date Goal status  1 Improve FOTO to 23. Baseline: 44 03/15/2022 On going Assessed 02/05/2022  2 [name] will report Rt elbow pain consistently < 3/10 on the VAS with ADLs.  03/15/2022 On going Assessed 02/05/2022  3 Improve Rt elbow AROM to -20 to 135 degrees.  03/15/2022 On going Assessed 02/05/2022  [place] will have Rt grip strength at 90% or better compared to the uninvolved L.  03/15/2022 On going Assessed 02/05/2022  [place] will be independent with her long-term HEP at DC.  03/15/2022 On going Assessed 02/05/2022    PLAN: PT FREQUENCY:  2-3X/week   PT DURATION: 8 weeks   PLANNED INTERVENTIONS: Therapeutic exercises, Therapeutic activity, Neuro Muscular re-education, Patient/Family education, Joint mobilization, Cryotherapy, Moist heat, and Manual therapy   PLAN FOR NEXT SESSION:  Manual for ROM (Pt very fond of mobilizations), progressive stretching and strengthening.     Scot Jun, PT, DPT, OCS, ATC 02/12/22  12:22 PM

## 2022-02-12 NOTE — Progress Notes (Signed)
° °  Post-Op Visit Note   Patient: Tiffany Lopez           Date of Birth: 03/06/1961           MRN: [id number] Visit Date: 02/12/2022 PCP: Haywood Pao, MD   Assessment & Plan:  Chief Complaint:  Chief Complaint  Patient presents with   Right Elbow - Pain   Visit Diagnoses:  1. Closed fracture of olecranon process of right ulna, initial encounter     Plan: [name] is 8 weeks status post ORIF right olecranon fracture.  Overall doing well and has been working really hard range of motion.  She is doing physical therapy at our office.  Examination of right elbow shows a fully healed surgical scar.  She has residual moderate swelling.  She has full pronation supination.  Elbow range of motion is 20 to 99 degrees.  The fracture has demonstrated radiographic healing.  Clinically she is doing well.  She is mostly limited by the lack of elbow flexion from a functional standpoint.  Based on her physical therapy reports she has made significant improvement just in a week.  She will continue to work aggressively at range of motion.  I am going to send in some diclofenac for inflammation and she is going to use Voltaren gel as well.  She may try to use a 2 pound weight on her walks to try to help with extension.  Recheck in 6 weeks with two-view x-rays of the right elbow.  Follow-Up Instructions: Return in about 6 weeks (around 03/26/2022).   Orders:  Orders Placed This Encounter  Procedures   XR Elbow 2 Views Right   Meds ordered this encounter  Medications   diclofenac (VOLTAREN) 75 MG EC tablet    Sig: Take 1 tablet (75 mg total) by mouth 2 (two) times daily.    Dispense:  30 tablet    Refill:  2    Imaging: XR Elbow 2 Views Right  Result Date: 02/12/2022 Healed olecranon fracture without hardware complications.   PMFS History: Patient Active Problem List   Diagnosis Date Noted   Closed olecranon fracture, right, initial encounter 12/19/2021   Past Medical History:   Diagnosis Date   Allergy    Cancer ([place])    melanoma skin cancer   Olecranon fracture, right, closed, initial encounter    Psoriasis     Family History  Problem Relation Age of Onset   Colon cancer Paternal Grandmother    Esophageal cancer Neg Hx    Stomach cancer Neg Hx    Rectal cancer Neg Hx     Past Surgical History:  Procedure Laterality Date   COLONOSCOPY  2017   Dr.Armbruster   DILATION AND CURETTAGE OF UTERUS  12/16/2005   ORIF ELBOW FRACTURE Right 12/19/2021   Procedure: OPEN REDUCTION INTERNAL FIXATION (ORIF) ELBOW/OLECRANON FRACTURE;  Surgeon: Leandrew Koyanagi, MD;  Location: [place];  Service: Orthopedics;  Laterality: Right;   POLYPECTOMY     Social History   Occupational History   Not on file  Tobacco Use   Smoking status: Never   Smokeless tobacco: Never  Vaping Use   Vaping Use: Never used  Substance and Sexual Activity   Alcohol use: Not Currently    Comment: rare   Drug use: No   Sexual activity: Yes    Birth control/protection: Post-menopausal

## 2022-02-13 ENCOUNTER — Other Ambulatory Visit (HOSPITAL_COMMUNITY): Payer: Self-pay

## 2022-02-14 ENCOUNTER — Other Ambulatory Visit: Payer: Self-pay

## 2022-02-14 ENCOUNTER — Encounter: Payer: Self-pay | Admitting: Rehabilitative and Restorative Service Providers"

## 2022-02-14 ENCOUNTER — Ambulatory Visit: Payer: 59 | Admitting: Rehabilitative and Restorative Service Providers"

## 2022-02-14 DIAGNOSIS — M25621 Stiffness of right elbow, not elsewhere classified: Secondary | ICD-10-CM

## 2022-02-14 DIAGNOSIS — R600 Localized edema: Secondary | ICD-10-CM

## 2022-02-14 DIAGNOSIS — M25521 Pain in right elbow: Secondary | ICD-10-CM

## 2022-02-14 DIAGNOSIS — M6281 Muscle weakness (generalized): Secondary | ICD-10-CM

## 2022-02-14 NOTE — Therapy (Signed)
?OUTPATIENT PHYSICAL THERAPY TREATMENT NOTE ? ? ?Patient Name: Tiffany Lopez ?MRN: [id number] ?DOB:[birth year], 61 y.o., female ?Today's Date: 02/14/2022 ? ?PCP: Haywood Pao, MD ?REFERRING PROVIDER: Leandrew Koyanagi, MD  ? ? PT End of Session - 02/14/22 3825   ? ? Visit Number 9   ? Number of Visits 24   ? Date for PT Re-Evaluation 03/15/22   ? PT Start Time [phone]   ? PT Stop Time [phone]   ? PT Time Calculation (min) 39 min   ? Activity Tolerance Patient tolerated treatment well   ? Behavior During Therapy [facility] for tasks assessed/performed   ? ?  ?  ? ?  ? ? ? ? ? ? ? ? ?Past Medical History:  ?Diagnosis Date  ? Allergy   ? Cancer [facility])   ? melanoma skin cancer  ? Olecranon fracture, right, closed, initial encounter   ? Psoriasis   ? ?Past Surgical History:  ?Procedure Laterality Date  ? COLONOSCOPY  2017  ? Dr.Armbruster  ? DILATION AND CURETTAGE OF UTERUS  12/16/2005  ? ORIF ELBOW FRACTURE Right 12/19/2021  ? Procedure: OPEN REDUCTION INTERNAL FIXATION (ORIF) ELBOW/OLECRANON FRACTURE;  Surgeon: Leandrew Koyanagi, MD;  Location: [place];  Service: Orthopedics;  Laterality: Right;  ? POLYPECTOMY    ? ?Patient Active Problem List  ? Diagnosis Date Noted  ? Closed olecranon fracture, right, initial encounter 12/19/2021  ? ? ?REFERRING DIAG: S52.021A (ICD-10-CM) - Closed fracture of olecranon process of right ulna, initial encounter  ? ?THERAPY DIAG:  ?Stiffness of right elbow, not elsewhere classified ? ?Pain in right elbow ? ?Muscle weakness (generalized) ? ?Localized edema ? ?PERTINENT HISTORY: NA  ? ?PRECAUTIONS: Strengthening allowed after 01/30/2022 per MD referral ? ?SUBJECTIVE: feels like flexion will not budge.  ? ? ?PAIN:  ?Are you having pain? No pain ? ? ? ? ?OBJECTIVE:  ?  ? ?PATIENT SURVEYS:  ?01/18/22: FOTO 44 (Goal 71) ?  ?                              ?UPPER EXTREMITY AROM/PROM: ?  ?A/PROM Right ?01/18/2022 Left ?01/18/2022 Right ?01/21/22 Right  ?01/24/22 Right 01/31/22 Right ?02/05/2022 Right ?02/07/22  Right 02/14/2022  ?Shoulder flexion            ?Shoulder extension            ?Shoulder abduction            ?Shoulder adduction            ?Shoulder internal rotation            ?Shoulder external rotation            ?Elbow flexion 77 150 P 91 P 99 A 93 In supine AROM 94  Standing  ?P 100 ?A 95 Active 102 degrees standing  ?Elbow extension -48 -7 P -22 P -20 A -31 In Supine AROM -25 ?PROM -22  Active -31 degrees standing  ?Wrist flexion            ?Wrist extension            ?Wrist ulnar deviation            ?Wrist radial deviation            ?Wrist pronation            ?Wrist supination            ?(  Blank rows = not tested) ?  ?UPPER EXTREMITY MMT:  ?  ?MMT Right ?02/05/2022 Left ?02/05/2022     ?Shoulder flexion         ?Shoulder extension         ?Shoulder abduction         ?Shoulder adduction         ?Shoulder internal rotation         ?Shoulder external rotation         ?Elbow flexion 17.5, 17.9 lbs in supine 80 deg flexion 28, 30 lb ?in supine 80 deg flexion     ?Elbow extension 9.7, 9.1 lbs in supine 80 deg flexion 16.2, 15.8 lbs ?in supine 80 deg flexion     ?Wrist flexion  5/5      ?Wrist extension  5/5      ?Wrist ulnar deviation         ?Wrist radial deviation         ?Wrist pronation  4/5       ?Wrist supination  4/5       ? ?  ?TODAY'S TREATMENT:  ? ?02/14/2022 ?Low load long duration stretching elbow flexion and extension (2# extension, self-overpressure flexion) 2X 3 minutes in each direction. ? ?Active elbow flexion and extension ROM 10X 10 seconds each direction (stretching) ? ?Manual overpressure applied with active supine elbow flexion and extension AAROM 20X 10 seconds each ? ? ?02/12/22 ?Therex:   ?Bicep Curl Rt 3x10 with 5#, eccentric focus with hold at end ?[employer] with 3# 2x10 ; eccentric focus with hold at end ?Seated pronation/supination c 3 lb weight (hammer type hold) x 15 each direction c arm at side ? ? ? ?Manual: G3-G4 Rt elbow mobilizations AP/PA to radiohumeral and  ulna/humeral joint, PROM flexion and extension to Rt elbow c contract/relax techniques ? ? ?02/07/22 ?Therex:   ?Bicep Curl Rt 2x10 with 5#, eccentric focus with hold at end ?[employer] with 2# 2x10 ; eccentric focus with hold at end ?Seated pronation/supination c 2 lb weight (hammer type hold) x 20 each direction c arm at side ? ? ? ?Manual: G3-G4 Rt elbow mobilizations AP/PA to radiohumeral and ulna/humeral joint, PROM flexion and extension to Rt elbow ? ? ?PATIENT EDUCATION: ?02/14/2022: ?Education details: HEP review with emphasis on flexion and extension ?Person educated: Patient ?Education method: Explanation, Demonstration, Tactile cues, Verbal cues, and Handouts ?Education comprehension: verbalized understanding, returned demonstration, verbal cues required, tactile cues required, and needs further education ?  ?  ?HOME EXERCISE PROGRAM: ?Access Code: [email or web address] ?URL: [email or web address] ?Date: 02/05/2022 ?Prepared by: Scot Jun ? ?Exercises ?Elbow AROM Flexion/Extension Forearm in Neutral in Supine - 2-5 x daily - 7 x weekly - 1-3 sets - 10 reps - 10-15 seconds hold ?Elbow AROM Blocked Extension in Neutral Position - 3-5 x daily - 7 x weekly - 1-2 sets - 10 reps - 10-15 seconds hold ?Seated Forearm Supination Pronation - 2-3 x daily - 7 x weekly - 1 sets - 10 reps - 10 seconds hold ?Bicep Stretch at Table - 2-3 x daily - 7 x weekly - 1 sets - 1-2 reps - 20-30 sec hold ?Supine Elbow Flexion Extension AROM - 2-3 x daily - 7 x weekly - 1-2 sets - 1-2 reps - 2-5 min hold ?Forearm Pronation and Supination with Hammer (Mirrored) - 1-2 x daily - 7 x weekly - 1-2 sets - 10 reps ?Seated Isometric Elbow Flexion - 1-2 x daily - 7 x  weekly - 1 sets - 5 reps - 10 hold ?Seated Isometric Elbow Extension - 1-2 x daily - 7 x weekly - 1 sets - 5 reps - 10 hold ? ?  ?ASSESSMENT: ?  ?CLINICAL IMPRESSION: Tiffany Lopez is doing a good job with her home exercises focused on elbow flexion and extension AROM.  We  reviewed how she has a 3-4 month window to return as much AROM as possible before AROM gains become much more difficult to get and recommendations were made to increase weights with low load, long-duration stretching (3#).  Continue current plan focused on elbow AROM. ? ? ?REHAB POTENTIAL: Good ?  ?CLINICAL DECISION MAKING: Stable/uncomplicated ?  ?EVALUATION COMPLEXITY: Low ?  ?  ?GOALS: ?Goals reviewed with patient? Yes ?  ?SHORT TERM GOALS: ?  ?STG Name Target Date Goal status  ?1 Improve Rt elbow AROM to -30 to 105 degrees. ? 02/15/2022 -31 to 102 on 02/14/2022  ?[place] will report independence and compliance with her day 1 HEP. ? 02/01/2022 MET  ?  ?LONG TERM GOALS:  ?  ?LTG Name Target Date Goal status  ?1 Improve FOTO to 71. ?Baseline: 44 03/15/2022 On going ?Assessed 02/05/2022  ?[place] will report Rt elbow pain consistently < 3/10 on the VAS with ADLs. ? 03/15/2022 On going ?Assessed 02/05/2022  ?3 Improve Rt elbow AROM to -20 to 135 degrees. ? 03/15/2022 On going ?Assessed 02/05/2022  ?[place] will have Rt grip strength at 90% or better compared to the uninvolved L. ? 03/15/2022 On going ?Assessed 02/05/2022  ?5 [name] will be independent with her long-term HEP at [place]. ? 03/15/2022 On going ?Assessed 02/05/2022  ?  ?PLAN: ?PT FREQUENCY:  2-3X/week ?  ?PT DURATION: 8 weeks ?  ?PLANNED INTERVENTIONS: Therapeutic exercises, Therapeutic activity, Neuro Muscular re-education, Patient/Family education, Joint mobilization, Cryotherapy, Moist heat, and Manual therapy ?  ?PLAN FOR NEXT SESSION:  ?Elbow flexion and extension AROM emphasis. ? ? ? ?Vista Mink PT, MPT ?02/14/22  8:42 AM ? ? ? ? ? ? ?  ? ?

## 2022-02-19 ENCOUNTER — Other Ambulatory Visit: Payer: Self-pay

## 2022-02-19 ENCOUNTER — Encounter: Payer: Self-pay | Admitting: Physical Therapy

## 2022-02-19 ENCOUNTER — Ambulatory Visit: Payer: 59 | Admitting: Physical Therapy

## 2022-02-19 DIAGNOSIS — M6281 Muscle weakness (generalized): Secondary | ICD-10-CM

## 2022-02-19 DIAGNOSIS — R600 Localized edema: Secondary | ICD-10-CM

## 2022-02-19 DIAGNOSIS — M25621 Stiffness of right elbow, not elsewhere classified: Secondary | ICD-10-CM | POA: Diagnosis not present

## 2022-02-19 DIAGNOSIS — M25521 Pain in right elbow: Secondary | ICD-10-CM

## 2022-02-19 NOTE — Therapy (Signed)
?OUTPATIENT PHYSICAL THERAPY TREATMENT NOTE ? ? ?Patient Name: Tiffany Lopez ?MRN: [id number] ?DOB:27-Jan-1961, 61 y.o., female ?Today's Date: 02/19/2022 ? ?PCP: Haywood Pao, MD ?REFERRING PROVIDER: Leandrew Koyanagi, MD  ? ? PT End of Session - 02/19/22 0809   ? ? Visit Number 10   ? Number of Visits 24   ? Date for PT Re-Evaluation 03/15/22   ? PT Start Time 0801   ? PT Stop Time 0840   ? PT Time Calculation (min) 39 min   ? Activity Tolerance Patient tolerated treatment well   ? Behavior During Therapy [facility] for tasks assessed/performed   ? ?  ?  ? ?  ? ? ? ? ? ? ? ? ? ?Past Medical History:  ?Diagnosis Date  ? Allergy   ? Cancer [facility])   ? melanoma skin cancer  ? Olecranon fracture, right, closed, initial encounter   ? Psoriasis   ? ?Past Surgical History:  ?Procedure Laterality Date  ? COLONOSCOPY  2017  ? Dr.Armbruster  ? DILATION AND CURETTAGE OF UTERUS  12/16/2005  ? ORIF ELBOW FRACTURE Right 12/19/2021  ? Procedure: OPEN REDUCTION INTERNAL FIXATION (ORIF) ELBOW/OLECRANON FRACTURE;  Surgeon: Leandrew Koyanagi, MD;  Location: [place];  Service: Orthopedics;  Laterality: Right;  ? POLYPECTOMY    ? ?Patient Active Problem List  ? Diagnosis Date Noted  ? Closed olecranon fracture, right, initial encounter 12/19/2021  ? ? ?REFERRING DIAG: S52.021A (ICD-10-CM) - Closed fracture of olecranon process of right ulna, initial encounter  ? ?THERAPY DIAG:  ?Stiffness of right elbow, not elsewhere classified ? ?Pain in right elbow ? ?Muscle weakness (generalized) ? ?Localized edema ? ?PERTINENT HISTORY: NA  ? ?PRECAUTIONS: Strengthening allowed after 01/30/2022 per MD referral ? ?SUBJECTIVE: can make a pony tail and get toothbrush into mouth; went kayaking on Sunday and mowed the yard on Saturday.  ? ?PAIN:  ?Are you having pain? No pain ? ? ? ? ?OBJECTIVE:  ?  ? ?PATIENT SURVEYS:  ?01/18/22: FOTO 44 (Goal 71) ?02/19/22: FOTO 74 ?  ?                              ?UPPER EXTREMITY AROM/PROM: ?  ?A/PROM Right ?01/18/2022  Left ?01/18/2022 Right ?01/21/22 Right  ?01/24/22 Right 01/31/22 Right ?02/05/2022 Right ?02/07/22 Right 02/14/2022 Right  ?02/19/22  ?Shoulder flexion             ?Shoulder extension             ?Shoulder abduction             ?Shoulder adduction             ?Shoulder internal rotation             ?Shoulder external rotation             ?Elbow flexion 77 150 P 91 P 99 A 93 In supine AROM 94  Standing  ?P 100 ?A 95 Active 102 degrees standing A 109 standing  ?Elbow extension -48 -7 P -22 P -20 A -31 In Supine AROM -25 ?PROM -22  Active -31 degrees standing A - 24 ?standing  ?Wrist flexion             ?Wrist extension             ?Wrist ulnar deviation             ?Wrist radial deviation             ?  Wrist pronation             ?Wrist supination             ?(Blank rows = not tested) ?  ?UPPER EXTREMITY MMT:  ?  ?MMT Right ?02/05/2022 Left ?02/05/2022     ?Shoulder flexion         ?Shoulder extension         ?Shoulder abduction         ?Shoulder adduction         ?Shoulder internal rotation         ?Shoulder external rotation         ?Elbow flexion 17.5, 17.9 lbs in supine 80 deg flexion 28, 30 lb ?in supine 80 deg flexion     ?Elbow extension 9.7, 9.1 lbs in supine 80 deg flexion 16.2, 15.8 lbs ?in supine 80 deg flexion     ?Wrist flexion  5/5      ?Wrist extension  5/5      ?Wrist ulnar deviation         ?Wrist radial deviation         ?Wrist pronation  4/5       ?Wrist supination  4/5       ? ?  ?TODAY'S TREATMENT:  ?02/19/22 ?Low load long duration stretching elbow flexion and extension (3#) X 5 minutes in each direction. ? ?Manual PROM flexion and extension ? ?02/14/2022 ?Low load long duration stretching elbow flexion and extension (2# extension, self-overpressure flexion) 2X 3 minutes in each direction. ? ?Active elbow flexion and extension ROM 10X 10 seconds each direction (stretching) ? ?Manual overpressure applied with active supine elbow flexion and extension AAROM 20X 10 seconds each ? ? ?02/12/22 ?Therex:   ?Bicep Curl Rt  3x10 with 5#, eccentric focus with hold at end ?[employer] with 3# 2x10 ; eccentric focus with hold at end ?Seated pronation/supination c 3 lb weight (hammer type hold) x 15 each direction c arm at side ? ? ? ?Manual: G3-G4 Rt elbow mobilizations AP/PA to radiohumeral and ulna/humeral joint, PROM flexion and extension to Rt elbow c contract/relax techniques ? ?PATIENT EDUCATION: ?02/14/2022: ?Education details: HEP review with emphasis on flexion and extension ?Person educated: Patient ?Education method: Explanation, Demonstration, Tactile cues, Verbal cues, and Handouts ?Education comprehension: verbalized understanding, returned demonstration, verbal cues required, tactile cues required, and needs further education ?  ?  ?HOME EXERCISE PROGRAM: ?Access Code: [email or web address] ?URL: [email or web address] ?Date: 02/05/2022 ?Prepared by: Scot Jun ? ?Exercises ?Elbow AROM Flexion/Extension Forearm in Neutral in Supine - 2-5 x daily - 7 x weekly - 1-3 sets - 10 reps - 10-15 seconds hold ?Elbow AROM Blocked Extension in Neutral Position - 3-5 x daily - 7 x weekly - 1-2 sets - 10 reps - 10-15 seconds hold ?Seated Forearm Supination Pronation - 2-3 x daily - 7 x weekly - 1 sets - 10 reps - 10 seconds hold ?Bicep Stretch at Table - 2-3 x daily - 7 x weekly - 1 sets - 1-2 reps - 20-30 sec hold ?Supine Elbow Flexion Extension AROM - 2-3 x daily - 7 x weekly - 1-2 sets - 1-2 reps - 2-5 min hold ?Forearm Pronation and Supination with Hammer (Mirrored) - 1-2 x daily - 7 x weekly - 1-2 sets - 10 reps ?Seated Isometric Elbow Flexion - 1-2 x daily - 7 x weekly - 1 sets - 5 reps - 10 hold ?Seated Isometric Elbow Extension - 1-2 x daily -  7 x weekly - 1 sets - 5 reps - 10 hold ? ?  ?ASSESSMENT: ?  ?CLINICAL IMPRESSION: Pt with steady progress, and FOTO goal met today.  Still has some functional limitations and hopefully will be able to improve function with improved ROM.   Will continue to benefit from PT to  maximize function. ? ? ?REHAB POTENTIAL: Good ?  ?CLINICAL DECISION MAKING: Stable/uncomplicated ?  ?EVALUATION COMPLEXITY: Low ?  ?  ?GOALS: ?Goals reviewed with patient? Yes ?  ?SHORT TERM GOALS: ?  ?STG Name Target Date Goal status  ?1 Improve Rt elbow AROM to -30 to 105 degrees. ? 02/15/2022 MET  ?02/19/22  ?2 [name] will report independence and compliance with her day 1 HEP. ? 02/01/2022 MET  ?  ?LONG TERM GOALS:  ?  ?LTG Name Target Date Goal status  ?1 Improve FOTO to 71. ?Baseline: 44 03/15/2022 MET  ?2 [name] will report Rt elbow pain consistently < 3/10 on the VAS with ADLs. ? 03/15/2022 MET  ?3 Improve Rt elbow AROM to -20 to 135 degrees. ? 03/15/2022 On going ?Assessed 02/05/2022  ?Winfred will have Rt grip strength at 90% or better compared to the uninvolved L. ? 03/15/2022 On going ?Assessed 02/05/2022  ?5 [name] will be independent with her long-term HEP at DC. ? 03/15/2022 On going ?Assessed 02/05/2022  ?  ?PLAN: ?PT FREQUENCY:  2-3X/week ?  ?PT DURATION: 8 weeks ?  ?PLANNED INTERVENTIONS: Therapeutic exercises, Therapeutic activity, Neuro Muscular re-education, Patient/Family education, Joint mobilization, Cryotherapy, Moist heat, and Manual therapy ?  ?PLAN FOR NEXT SESSION:  ?Continue motion focus ? ? ? ? ?Laureen Abrahams, PT, DPT ?02/19/22 8:43 AM ? ? ? ? ? ? ? ?  ? ?

## 2022-02-21 ENCOUNTER — Ambulatory Visit: Payer: 59 | Admitting: Physical Therapy

## 2022-02-21 ENCOUNTER — Other Ambulatory Visit: Payer: Self-pay

## 2022-02-21 ENCOUNTER — Encounter: Payer: Self-pay | Admitting: Physical Therapy

## 2022-02-21 DIAGNOSIS — R600 Localized edema: Secondary | ICD-10-CM | POA: Diagnosis not present

## 2022-02-21 DIAGNOSIS — M6281 Muscle weakness (generalized): Secondary | ICD-10-CM

## 2022-02-21 DIAGNOSIS — M25621 Stiffness of right elbow, not elsewhere classified: Secondary | ICD-10-CM

## 2022-02-21 DIAGNOSIS — M25521 Pain in right elbow: Secondary | ICD-10-CM | POA: Diagnosis not present

## 2022-02-21 NOTE — Therapy (Signed)
?OUTPATIENT PHYSICAL THERAPY TREATMENT NOTE ? ? ?Patient Name: MICHALE WEIKEL ?MRN: [id number] ?DOB:[birth year], 61 y.o., female ?Today's Date: 02/21/2022 ? ?PCP: Haywood Pao, MD ?REFERRING PROVIDER: Leandrew Koyanagi, MD  ? ? PT End of Session - 02/21/22 0817   ? ? Visit Number 11   ? Number of Visits 24   ? Date for PT Re-Evaluation 03/15/22   ? PT Start Time 0801   ? PT Stop Time [phone]   ? PT Time Calculation (min) 38 min   ? Activity Tolerance Patient tolerated treatment well   ? Behavior During Therapy [facility] for tasks assessed/performed   ? ?  ?  ? ?  ? ? ? ? ? ? ? ? ? ? ?Past Medical History:  ?Diagnosis Date  ? Allergy   ? Cancer [facility])   ? melanoma skin cancer  ? Olecranon fracture, right, closed, initial encounter   ? Psoriasis   ? ?Past Surgical History:  ?Procedure Laterality Date  ? COLONOSCOPY  2017  ? Dr.Armbruster  ? DILATION AND CURETTAGE OF UTERUS  12/16/2005  ? ORIF ELBOW FRACTURE Right 12/19/2021  ? Procedure: OPEN REDUCTION INTERNAL FIXATION (ORIF) ELBOW/OLECRANON FRACTURE;  Surgeon: Leandrew Koyanagi, MD;  Location: [place];  Service: Orthopedics;  Laterality: Right;  ? POLYPECTOMY    ? ?Patient Active Problem List  ? Diagnosis Date Noted  ? Closed olecranon fracture, right, initial encounter 12/19/2021  ? ? ?REFERRING DIAG: S52.021A (ICD-10-CM) - Closed fracture of olecranon process of right ulna, initial encounter  ? ?THERAPY DIAG:  ?No diagnosis found. ? ?PERTINENT HISTORY: NA  ? ?PRECAUTIONS: Strengthening allowed after 01/30/2022 per MD referral ? ?SUBJECTIVE: woke up with arm tucked under pillow, otherwise about the same  ? ?PAIN:  ?Are you having pain? No pain ? ? ? ? ?OBJECTIVE:  ?  ? ?PATIENT SURVEYS:  ?01/18/22: FOTO 44 (Goal 71) ?02/19/22: FOTO 74 ?  ?                              ?UPPER EXTREMITY AROM/PROM: ?  ?A/PROM Right ?01/18/2022 Left ?01/18/2022 Right ?01/21/22 Right  ?01/24/22 Right 01/31/22 Right ?02/05/2022 Right ?02/07/22 Right 02/14/2022 Right  ?02/19/22 Right ?02/21/22  ?Shoulder  flexion              ?Shoulder extension              ?Shoulder abduction              ?Shoulder adduction              ?Shoulder internal rotation              ?Shoulder external rotation              ?Elbow flexion 77 150 P 91 P 99 A 93 In supine AROM 94  Standing  ?P 100 ?A 95 Active 102 degrees standing A 109 standing AA 113  ?Elbow extension -48 -7 P -22 P -20 A -31 In Supine AROM -25 ?PROM -22  Active -31 degrees standing A - 24 ?standing P -8 (after LLLD)  ?Wrist flexion              ?Wrist extension              ?Wrist ulnar deviation              ?Wrist radial deviation              ?  Wrist pronation              ?Wrist supination              ?(Blank rows = not tested) ?  ?UPPER EXTREMITY MMT:  ?  ?MMT Right ?02/05/2022 Left ?02/05/2022     ?Shoulder flexion         ?Shoulder extension         ?Shoulder abduction         ?Shoulder adduction         ?Shoulder internal rotation         ?Shoulder external rotation         ?Elbow flexion 17.5, 17.9 lbs in supine 80 deg flexion 28, 30 lb ?in supine 80 deg flexion     ?Elbow extension 9.7, 9.1 lbs in supine 80 deg flexion 16.2, 15.8 lbs ?in supine 80 deg flexion     ?Wrist flexion  5/5      ?Wrist extension  5/5      ?Wrist ulnar deviation         ?Wrist radial deviation         ?Wrist pronation  4/5       ?Wrist supination  4/5       ? ?02/21/22 Grip Strength:  Lt 45.6# ?     Rt 30.2# ? ?TODAY'S TREATMENT:  ?02/21/22 ?Low load long duration stretching elbow flexion and extension (3#) X 5 minutes in each direction. ?Manual PROM flexion and extension with contract/relax for flexion ?Use of digi-flex and flex bar for wrist/hand exercises ? ?02/19/22 ?Low load long duration stretching elbow flexion and extension (3#) X 5 minutes in each direction. ? ?Manual PROM flexion and extension ? ?02/14/2022 ?Low load long duration stretching elbow flexion and extension (2# extension, self-overpressure flexion) 2X 3 minutes in each direction. ? ?Active elbow flexion and extension ROM  10X 10 seconds each direction (stretching) ? ?Manual overpressure applied with active supine elbow flexion and extension AAROM 20X 10 seconds each ? ?PATIENT EDUCATION: ?02/14/2022: ?Education details: HEP review with emphasis on flexion and extension ?Person educated: Patient ?Education method: Explanation, Demonstration, Tactile cues, Verbal cues, and Handouts ?Education comprehension: verbalized understanding, returned demonstration, verbal cues required, tactile cues required, and needs further education ?  ?  ?HOME EXERCISE PROGRAM: ?Access Code: [email or web address] ?URL: [email or web address] ?Date: 02/05/2022 ?Prepared by: Scot Jun ? ?Exercises ?Elbow AROM Flexion/Extension Forearm in Neutral in Supine - 2-5 x daily - 7 x weekly - 1-3 sets - 10 reps - 10-15 seconds hold ?Elbow AROM Blocked Extension in Neutral Position - 3-5 x daily - 7 x weekly - 1-2 sets - 10 reps - 10-15 seconds hold ?Seated Forearm Supination Pronation - 2-3 x daily - 7 x weekly - 1 sets - 10 reps - 10 seconds hold ?Bicep Stretch at Table - 2-3 x daily - 7 x weekly - 1 sets - 1-2 reps - 20-30 sec hold ?Supine Elbow Flexion Extension AROM - 2-3 x daily - 7 x weekly - 1-2 sets - 1-2 reps - 2-5 min hold ?Forearm Pronation and Supination with Hammer (Mirrored) - 1-2 x daily - 7 x weekly - 1-2 sets - 10 reps ?Seated Isometric Elbow Flexion - 1-2 x daily - 7 x weekly - 1 sets - 5 reps - 10 hold ?Seated Isometric Elbow Extension - 1-2 x daily - 7 x weekly - 1 sets - 5 reps - 10 hold ? ?  ?ASSESSMENT: ?  ?CLINICAL IMPRESSION:  ?Improvements  noted today in flexion and extension following LLLD stretching.  Grip strength decreased still on Rt so demonstrated ways to work on improving this.  Will continue to benefit from PT to maximize function. ? ?REHAB POTENTIAL: Good ?  ?CLINICAL DECISION MAKING: Stable/uncomplicated ?  ?EVALUATION COMPLEXITY: Low ?  ?  ?GOALS: ?Goals reviewed with patient? Yes ?  ?SHORT TERM GOALS: ?  ?STG Name Target Date  Goal status  ?1 Improve Rt elbow AROM to -30 to 105 degrees. ? 02/15/2022 MET  ?02/19/22  ?2 [name] will report independence and compliance with her day 1 HEP. ? 02/01/2022 MET  ?  ?LONG TERM GOALS:  ?  ?LTG Name Target Date Goal status  ?1 Improve FOTO to 71. ?Baseline: 44 03/15/2022 MET  ?2 [name] will report Rt elbow pain consistently < 3/10 on the VAS with ADLs. ? 03/15/2022 MET  ?3 Improve Rt elbow AROM to -20 to 135 degrees. ? 03/15/2022 Partially met  ?02/21/22  ?4 [name] will have Rt grip strength at 90% or better compared to the uninvolved L. ? 03/15/2022 On going ?Assessed 02/05/2022  ?5 [name] will be independent with her long-term HEP at DC. ? 03/15/2022 On going ?Assessed 02/05/2022  ?  ?PLAN: ?PT FREQUENCY:  2-3X/week ?  ?PT DURATION: 8 weeks ?  ?PLANNED INTERVENTIONS: Therapeutic exercises, Therapeutic activity, Neuro Muscular re-education, Patient/Family education, Joint mobilization, Cryotherapy, Moist heat, and Manual therapy ?  ?PLAN FOR NEXT SESSION:  ?See if [employer] reached out to pt, Continue motion focus, hand/wrist exercises ? ? ? ? ?Laureen Abrahams, PT, DPT ?02/21/22 8:45 AM ? ? ? ? ? ? ? ?  ? ?

## 2022-02-26 ENCOUNTER — Encounter: Payer: Self-pay | Admitting: Physical Therapy

## 2022-02-26 ENCOUNTER — Ambulatory Visit: Payer: 59 | Admitting: Physical Therapy

## 2022-02-26 ENCOUNTER — Other Ambulatory Visit: Payer: Self-pay

## 2022-02-26 DIAGNOSIS — M25521 Pain in right elbow: Secondary | ICD-10-CM

## 2022-02-26 DIAGNOSIS — M6281 Muscle weakness (generalized): Secondary | ICD-10-CM | POA: Diagnosis not present

## 2022-02-26 DIAGNOSIS — R600 Localized edema: Secondary | ICD-10-CM

## 2022-02-26 DIAGNOSIS — M25621 Stiffness of right elbow, not elsewhere classified: Secondary | ICD-10-CM

## 2022-02-26 NOTE — Therapy (Signed)
?OUTPATIENT PHYSICAL THERAPY TREATMENT NOTE ? ? ?Patient Name: Tiffany Lopez ?MRN: [id number] ?DOB:May 28, 1961, 61 y.o., female ?Today's Date: 02/26/2022 ? ?PCP: Haywood Pao, MD ?REFERRING PROVIDER: Leandrew Koyanagi, MD  ? ? PT End of Session - 02/26/22 1245   ? ? Visit Number 12   ? Number of Visits 24   ? Date for PT Re-Evaluation 03/15/22   ? PT Start Time 0800   ? PT Stop Time [phone]   ? PT Time Calculation (min) 38 min   ? Activity Tolerance Patient tolerated treatment well   ? Behavior During Therapy [facility] for tasks assessed/performed   ? ?  ?  ? ?  ? ? ? ? ? ? ? ? ? ? ? ?Past Medical History:  ?Diagnosis Date  ? Allergy   ? Cancer [facility])   ? melanoma skin cancer  ? Olecranon fracture, right, closed, initial encounter   ? Psoriasis   ? ?Past Surgical History:  ?Procedure Laterality Date  ? COLONOSCOPY  2017  ? Dr.Armbruster  ? DILATION AND CURETTAGE OF UTERUS  12/16/2005  ? ORIF ELBOW FRACTURE Right 12/19/2021  ? Procedure: OPEN REDUCTION INTERNAL FIXATION (ORIF) ELBOW/OLECRANON FRACTURE;  Surgeon: Leandrew Koyanagi, MD;  Location: [place];  Service: Orthopedics;  Laterality: Right;  ? POLYPECTOMY    ? ?Patient Active Problem List  ? Diagnosis Date Noted  ? Closed olecranon fracture, right, initial encounter 12/19/2021  ? ? ?REFERRING DIAG: S52.021A (ICD-10-CM) - Closed fracture of olecranon process of right ulna, initial encounter  ? ?THERAPY DIAG:  ?Stiffness of right elbow, not elsewhere classified ? ?Pain in right elbow ? ?Muscle weakness (generalized) ? ?Localized edema ? ?PERTINENT HISTORY: NA  ? ?PRECAUTIONS: Strengthening allowed after 01/30/2022 per MD referral ? ?SUBJECTIVE: feels like she's able to do more with ADLs  ? ?PAIN:  ?Are you having pain? No pain ? ? ? ? ?OBJECTIVE:  ?  ? ?PATIENT SURVEYS:  ?01/18/22: FOTO 44 (Goal 71) ?02/19/22: FOTO 74 ?  ?                              ?UPPER EXTREMITY AROM/PROM: ?  ?A/PROM Right ?01/18/2022 Left ?01/18/2022 Right ?01/21/22 Right  ?01/24/22 Right 01/31/22  Right ?02/05/2022 Right ?02/07/22 Right 02/14/2022 Right  ?02/19/22 Right ?02/21/22  ?Shoulder flexion              ?Shoulder extension              ?Shoulder abduction              ?Shoulder adduction              ?Shoulder internal rotation              ?Shoulder external rotation              ?Elbow flexion 77 150 P 91 P 99 A 93 In supine AROM 94  Standing  ?P 100 ?A 95 Active 102 degrees standing A 109 standing AA 113  ?Elbow extension -48 -7 P -22 P -20 A -31 In Supine AROM -25 ?PROM -22  Active -31 degrees standing A - 24 ?standing P -8 (after LLLD)  ?Wrist flexion              ?Wrist extension              ?Wrist ulnar deviation              ?  Wrist radial deviation              ?Wrist pronation              ?Wrist supination              ?(Blank rows = not tested) ?  ?UPPER EXTREMITY MMT:  ?  ?MMT Right ?02/05/2022 Left ?02/05/2022     ?Shoulder flexion         ?Shoulder extension         ?Shoulder abduction         ?Shoulder adduction         ?Shoulder internal rotation         ?Shoulder external rotation         ?Elbow flexion 17.5, 17.9 lbs in supine 80 deg flexion 28, 30 lb ?in supine 80 deg flexion     ?Elbow extension 9.7, 9.1 lbs in supine 80 deg flexion 16.2, 15.8 lbs ?in supine 80 deg flexion     ?Wrist flexion  5/5      ?Wrist extension  5/5      ?Wrist ulnar deviation         ?Wrist radial deviation         ?Wrist pronation  4/5       ?Wrist supination  4/5       ? ?02/21/22 Grip Strength:  Lt 45.6# ?     Rt 30.2# ? ?TODAY'S TREATMENT:  ?02/26/22 ?Baseline measurements: AROM 25-0-108 ?LLLD stretch 4# 2 x 3 min, flexion and extension, rest breaks PRN between sets ?Manual: PROM with extended holds for flexion and extension ? ?Post-Treatment measurements: 10-0-121 ? ? ? ?02/21/22 ?Low load long duration stretching elbow flexion and extension (3#) X 5 minutes in each direction. ?Manual PROM flexion and extension with contract/relax for flexion ?Use of digi-flex and flex bar for wrist/hand exercises ? ?02/19/22 ?Low  load long duration stretching elbow flexion and extension (3#) X 5 minutes in each direction. ? ?Manual PROM flexion and extension ? ?02/14/2022 ?Low load long duration stretching elbow flexion and extension (2# extension, self-overpressure flexion) 2X 3 minutes in each direction. ? ?Active elbow flexion and extension ROM 10X 10 seconds each direction (stretching) ? ?Manual overpressure applied with active supine elbow flexion and extension AAROM 20X 10 seconds each ? ?PATIENT EDUCATION: ?02/14/2022: ?Education details: HEP review with emphasis on flexion and extension ?Person educated: Patient ?Education method: Explanation, Demonstration, Tactile cues, Verbal cues, and Handouts ?Education comprehension: verbalized understanding, returned demonstration, verbal cues required, tactile cues required, and needs further education ?  ?  ?HOME EXERCISE PROGRAM: ?Access Code: [email or web address] ?URL: [email or web address] ?Date: 02/05/2022 ?Prepared by: Scot Jun ? ?Exercises ?Elbow AROM Flexion/Extension Forearm in Neutral in Supine - 2-5 x daily - 7 x weekly - 1-3 sets - 10 reps - 10-15 seconds hold ?Elbow AROM Blocked Extension in Neutral Position - 3-5 x daily - 7 x weekly - 1-2 sets - 10 reps - 10-15 seconds hold ?Seated Forearm Supination Pronation - 2-3 x daily - 7 x weekly - 1 sets - 10 reps - 10 seconds hold ?Bicep Stretch at Table - 2-3 x daily - 7 x weekly - 1 sets - 1-2 reps - 20-30 sec hold ?Supine Elbow Flexion Extension AROM - 2-3 x daily - 7 x weekly - 1-2 sets - 1-2 reps - 2-5 min hold ?Forearm Pronation and Supination with Hammer (Mirrored) - 1-2 x daily - 7 x weekly - 1-2 sets - 10 reps ?  Seated Isometric Elbow Flexion - 1-2 x daily - 7 x weekly - 1 sets - 5 reps - 10 hold ?Seated Isometric Elbow Extension - 1-2 x daily - 7 x weekly - 1 sets - 5 reps - 10 hold ? ?  ?ASSESSMENT: ?  ?CLINICAL IMPRESSION:  ?Pt has made good improvements in AROM over the past week, and reports improved function with  ADLs.  May be able to hold PT and continue work on ROM at home, but will further discuss at next visit.  Overall progressing well. ? ? ?REHAB POTENTIAL: Good ?  ?CLINICAL DECISION MAKING: Stable/uncomplicated ?  ?EVALUATION COMPLEXITY: Low ?  ?  ?GOALS: ?Goals reviewed with patient? Yes ?  ?SHORT TERM GOALS: ?  ?STG Name Target Date Goal status  ?1 Improve Rt elbow AROM to -30 to 105 degrees. ? 02/15/2022 MET  ?02/19/22  ?2 [name] will report independence and compliance with her day 1 HEP. ? 02/01/2022 MET  ?  ?LONG TERM GOALS:  ?  ?LTG Name Target Date Goal status  ?1 Improve FOTO to 71. ?Baseline: 44 03/15/2022 MET  ?2 [name] will report Rt elbow pain consistently < 3/10 on the VAS with ADLs. ? 03/15/2022 MET  ?3 Improve Rt elbow AROM to -20 to 135 degrees. ? 03/15/2022 Partially met  ?02/21/22  ?4 [name] will have Rt grip strength at 90% or better compared to the uninvolved L. ? 03/15/2022 On going ?Assessed 02/05/2022  ?5 [name] will be independent with her long-term HEP at DC. ? 03/15/2022 On going ?Assessed 02/05/2022  ?  ?PLAN: ?PT FREQUENCY:  2-3X/week ?  ?PT DURATION: 8 weeks ?  ?PLANNED INTERVENTIONS: Therapeutic exercises, Therapeutic activity, Neuro Muscular re-education, Patient/Family education, Joint mobilization, Cryotherapy, Moist heat, and Manual therapy ?  ?PLAN FOR NEXT SESSION:  ?Continue with ROM focus, may consider holding PT, wrist/hand strengthening ? ? ? ? ? ?Laureen Abrahams, PT, DPT ?02/26/22 8:41 AM ? ? ? ? ? ? ? ?  ? ?

## 2022-02-28 ENCOUNTER — Other Ambulatory Visit: Payer: Self-pay

## 2022-02-28 ENCOUNTER — Encounter: Payer: Self-pay | Admitting: Rehabilitative and Restorative Service Providers"

## 2022-02-28 ENCOUNTER — Ambulatory Visit: Payer: 59 | Admitting: Rehabilitative and Restorative Service Providers"

## 2022-02-28 DIAGNOSIS — M25521 Pain in right elbow: Secondary | ICD-10-CM

## 2022-02-28 DIAGNOSIS — R600 Localized edema: Secondary | ICD-10-CM

## 2022-02-28 DIAGNOSIS — M6281 Muscle weakness (generalized): Secondary | ICD-10-CM | POA: Diagnosis not present

## 2022-02-28 DIAGNOSIS — M25621 Stiffness of right elbow, not elsewhere classified: Secondary | ICD-10-CM

## 2022-02-28 NOTE — Therapy (Signed)
?OUTPATIENT PHYSICAL THERAPY TREATMENT NOTE ? ? ?Patient Name: Tiffany Lopez ?MRN: [id number] ?DOB:February 21, 1961, 61 y.o., female ?Today's Date: 02/28/2022 ? ?PCP: Haywood Pao, MD ?REFERRING PROVIDER: Leandrew Koyanagi, MD  ? ? PT End of Session - 02/28/22 0809   ? ? Visit Number 13   ? Number of Visits 24   ? Date for PT Re-Evaluation 03/15/22   ? PT Start Time 0805   ? PT Stop Time 0850   ? PT Time Calculation (min) 45 min   ? Activity Tolerance Patient tolerated treatment well   ? Behavior During Therapy [facility] for tasks assessed/performed   ? ?  ?  ? ?  ? ? ? ? ? ? ? ? ? ? ? ? ?Past Medical History:  ?Diagnosis Date  ? Allergy   ? Cancer [facility])   ? melanoma skin cancer  ? Olecranon fracture, right, closed, initial encounter   ? Psoriasis   ? ?Past Surgical History:  ?Procedure Laterality Date  ? COLONOSCOPY  2017  ? Dr.Armbruster  ? DILATION AND CURETTAGE OF UTERUS  12/16/2005  ? ORIF ELBOW FRACTURE Right 12/19/2021  ? Procedure: OPEN REDUCTION INTERNAL FIXATION (ORIF) ELBOW/OLECRANON FRACTURE;  Surgeon: Leandrew Koyanagi, MD;  Location: [place];  Service: Orthopedics;  Laterality: Right;  ? POLYPECTOMY    ? ?Patient Active Problem List  ? Diagnosis Date Noted  ? Closed olecranon fracture, right, initial encounter 12/19/2021  ? ? ?REFERRING DIAG: S52.021A (ICD-10-CM) - Closed fracture of olecranon process of right ulna, initial encounter  ? ?THERAPY DIAG:  ?Stiffness of right elbow, not elsewhere classified ? ?Pain in right elbow ? ?Muscle weakness (generalized) ? ?Localized edema ? ?PERTINENT HISTORY: NA  ? ?PRECAUTIONS: Strengthening allowed after 01/30/2022 per MD referral ? ?SUBJECTIVE: feels like she's able to do more with ADLs  ? ?PAIN:  ?Are you having pain? 0/10 ? ? ? ? ?OBJECTIVE:  ?  ? ?PATIENT SURVEYS:  ?01/18/22: FOTO 44 (Goal 71) ?02/19/22: FOTO 74 ?  ?                              ?UPPER EXTREMITY AROM/PROM: ?  ?A/PROM Right ?01/18/2022 Left ?01/18/2022 Right ?01/21/22 Right  ?01/24/22 Right 01/31/22  Right ?02/05/2022 Right ?02/07/22 Right 02/14/2022 Right  ?02/19/22 Right ?02/21/22 Right 02/28/22  ?Shoulder flexion               ?Shoulder extension               ?Shoulder abduction               ?Shoulder adduction               ?Shoulder internal rotation               ?Shoulder external rotation               ?Elbow flexion 77 150 P 91 P 99 A 93 In supine AROM 94  Standing  ?P 100 ?A 95 Active 102 degrees standing A 109 standing AA 113 Active in standing 117  ?Elbow extension -48 -7 P -22 P -20 A -31 In Supine AROM -25 ?PROM -22  Active -31 degrees standing A - 24 ?standing P -8 (after LLLD) Active in standing -23  ?Wrist flexion               ?Wrist extension               ?  Wrist ulnar deviation               ?Wrist radial deviation               ?Wrist pronation               ?Wrist supination               ?(Blank rows = not tested) ?  ?UPPER EXTREMITY MMT:  ?  ?MMT Right ?02/05/2022 Left ?02/05/2022     ?Shoulder flexion         ?Shoulder extension         ?Shoulder abduction         ?Shoulder adduction         ?Shoulder internal rotation         ?Shoulder external rotation         ?Elbow flexion 17.5, 17.9 lbs in supine 80 deg flexion 28, 30 lb ?in supine 80 deg flexion     ?Elbow extension 9.7, 9.1 lbs in supine 80 deg flexion 16.2, 15.8 lbs ?in supine 80 deg flexion     ?Wrist flexion  5/5      ?Wrist extension  5/5      ?Wrist ulnar deviation         ?Wrist radial deviation         ?Wrist pronation  4/5       ?Wrist supination  4/5       ? ?02/21/22 Grip Strength:  Lt 45.6# ?     Rt 30.2# ? ?02/28/22 Grip strength (L/R in pounds): 51.1#/34.0# ? ?TODAY'S TREATMENT:  ?02/28/22 ?Low load long duration stretching elbow flexion and extension (3# extension and flexion) 2X 3 minutes in each direction. ? ?Active elbow flexion and extension ROM 10X 10 seconds each direction (stretching) ? ?Manual overpressure applied with active supine elbow flexion and extension AAROM 20X 10 seconds each ? ?Pulley elbow flexion and  extension 10X 10 seconds each ? ?UBE seat close for flexion and seat far for extension 4 minutes each (:30 push/:30 pull) ? ? ?02/26/22 ?Baseline measurements: AROM 25-0-108 ?LLLD stretch 4# 2 x 3 min, flexion and extension, rest breaks PRN between sets ?Manual: PROM with extended holds for flexion and extension ? ?Post-Treatment measurements: 10-0-121 ? ? ?02/21/22 ?Low load long duration stretching elbow flexion and extension (3#) X 5 minutes in each direction. ?Manual PROM flexion and extension with contract/relax for flexion ?Use of digi-flex and flex bar for wrist/hand exercises ? ? ?PATIENT EDUCATION: ?02/14/2022: ?Education details: HEP review with emphasis on flexion and extension ?Person educated: Patient ?Education method: Explanation, Demonstration, Tactile cues, Verbal cues, and Handouts ?Education comprehension: verbalized understanding, returned demonstration, verbal cues required, tactile cues required, and needs further education ?  ?  ?HOME EXERCISE PROGRAM: ?Access Code: [email or web address] ?URL: [email or web address] ?Date: 02/05/2022 ?Prepared by: Scot Jun ? ?Exercises ?Elbow AROM Flexion/Extension Forearm in Neutral in Supine - 2-5 x daily - 7 x weekly - 1-3 sets - 10 reps - 10-15 seconds hold ?Elbow AROM Blocked Extension in Neutral Position - 3-5 x daily - 7 x weekly - 1-2 sets - 10 reps - 10-15 seconds hold ?Seated Forearm Supination Pronation - 2-3 x daily - 7 x weekly - 1 sets - 10 reps - 10 seconds hold ?Bicep Stretch at Table - 2-3 x daily - 7 x weekly - 1 sets - 1-2 reps - 20-30 sec hold ?Supine Elbow Flexion Extension AROM - 2-3 x daily - 7 x  weekly - 1-2 sets - 1-2 reps - 2-5 min hold ?Forearm Pronation and Supination with Hammer (Mirrored) - 1-2 x daily - 7 x weekly - 1-2 sets - 10 reps ?Seated Isometric Elbow Flexion - 1-2 x daily - 7 x weekly - 1 sets - 5 reps - 10 hold ?Seated Isometric Elbow Extension - 1-2 x daily - 7 x weekly - 1 sets - 5 reps - 10 hold ? ?  ?ASSESSMENT: ?   ?CLINICAL IMPRESSION:  ?Tiffany Lopez continues to make good, steady progress towards long-term goals.  AROM (elbow flexion and extension) remain the primary focus of her home and clinic programs.  Continue current plan with measurements to check objective progress weekly. ? ? ?REHAB POTENTIAL: Good ?  ?CLINICAL DECISION MAKING: Stable/uncomplicated ?  ?EVALUATION COMPLEXITY: Low ?  ?  ?GOALS: ?Goals reviewed with patient? Yes ?  ?SHORT TERM GOALS: ?  ?STG Name Target Date Goal status  ?1 Improve Rt elbow AROM to -30 to 105 degrees. ? 02/15/2022 MET  ?02/19/22  ?2 Tiffany Lopez will report independence and compliance with her day 1 HEP. ? 02/01/2022 MET  ?  ?LONG TERM GOALS:  ?  ?LTG Name Target Date Goal status  ?1 Improve FOTO to 71. ?Baseline: 44 03/15/2022 MET  ?2 Tiffany Lopez will report Rt elbow pain consistently < 3/10 on the VAS with ADLs. ? 03/15/2022 MET  ?3 Improve Rt elbow AROM to -20 to 135 degrees. ? 03/15/2022 On going ?02/28/22 ?-23 to 117  ?4 Tiffany Lopez will have Rt grip strength at 90% or better compared to the uninvolved L. ? 03/15/2022 On going ?66.5% 02/28/22  ?5 Tiffany Lopez will be independent with her long-term HEP at DC. ? 03/15/2022 On going ?Assessed 02/28/22  ?  ?PLAN: ?PT FREQUENCY:  2-3X/week ?  ?PT DURATION: 8 weeks ?  ?PLANNED INTERVENTIONS: Therapeutic exercises, Therapeutic activity, Neuro Muscular re-education, Patient/Family education, Joint mobilization, Cryotherapy, Moist heat, and Manual therapy ?  ?PLAN FOR NEXT SESSION:  ?Continue with elbow extension and flexion AROM focus to meet LTGs. ? ?Farley Ly PT, MPT ?02/28/22 8:45 AM ? ? ? ? ? ? ? ?  ? ?

## 2022-03-05 ENCOUNTER — Encounter: Payer: 59 | Admitting: Physical Therapy

## 2022-03-05 ENCOUNTER — Other Ambulatory Visit (HOSPITAL_COMMUNITY): Payer: Self-pay

## 2022-03-05 DIAGNOSIS — D1801 Hemangioma of skin and subcutaneous tissue: Secondary | ICD-10-CM | POA: Diagnosis not present

## 2022-03-05 DIAGNOSIS — L821 Other seborrheic keratosis: Secondary | ICD-10-CM | POA: Diagnosis not present

## 2022-03-05 DIAGNOSIS — Z85820 Personal history of malignant melanoma of skin: Secondary | ICD-10-CM | POA: Diagnosis not present

## 2022-03-05 DIAGNOSIS — L218 Other seborrheic dermatitis: Secondary | ICD-10-CM | POA: Diagnosis not present

## 2022-03-05 DIAGNOSIS — L812 Freckles: Secondary | ICD-10-CM | POA: Diagnosis not present

## 2022-03-05 DIAGNOSIS — Z85828 Personal history of other malignant neoplasm of skin: Secondary | ICD-10-CM | POA: Diagnosis not present

## 2022-03-05 DIAGNOSIS — D225 Melanocytic nevi of trunk: Secondary | ICD-10-CM | POA: Diagnosis not present

## 2022-03-05 MED ORDER — FLUOCINONIDE 0.05 % EX SOLN
1.0000 "application " | Freq: Two times a day (BID) | CUTANEOUS | 3 refills | Status: DC
  Filled 2022-03-05 – 2022-03-14 (×2): qty 60, 30d supply, fill #0

## 2022-03-07 ENCOUNTER — Encounter: Payer: Self-pay | Admitting: Rehabilitative and Restorative Service Providers"

## 2022-03-07 ENCOUNTER — Other Ambulatory Visit: Payer: Self-pay

## 2022-03-07 ENCOUNTER — Ambulatory Visit: Payer: 59 | Admitting: Rehabilitative and Restorative Service Providers"

## 2022-03-07 DIAGNOSIS — M25621 Stiffness of right elbow, not elsewhere classified: Secondary | ICD-10-CM

## 2022-03-07 DIAGNOSIS — M6281 Muscle weakness (generalized): Secondary | ICD-10-CM | POA: Diagnosis not present

## 2022-03-07 DIAGNOSIS — M25521 Pain in right elbow: Secondary | ICD-10-CM

## 2022-03-07 DIAGNOSIS — R600 Localized edema: Secondary | ICD-10-CM | POA: Diagnosis not present

## 2022-03-07 NOTE — Therapy (Signed)
?OUTPATIENT PHYSICAL THERAPY TREATMENT NOTE ? ? ?Patient Name: Tiffany Lopez ?MRN: [id number] ?DOB:12-30-1960, 61 y.o., female ?Today's Date: 03/07/2022 ? ?PCP: Haywood Pao, MD ?REFERRING PROVIDER: Leandrew Koyanagi, MD  ? ? PT End of Session - 03/07/22 3335   ? ? Visit Number 14   ? Number of Visits 24   ? Date for PT Re-Evaluation 03/15/22   ? PT Start Time 0801   ? PT Stop Time [phone]   ? PT Time Calculation (min) 42 min   ? Activity Tolerance Patient tolerated treatment well   ? Behavior During Therapy [facility] for tasks assessed/performed   ? ?  ?  ? ?  ? ? ? ? ? ? ? ? ? ? ? ? ? ?Past Medical History:  ?Diagnosis Date  ? Allergy   ? Cancer [facility])   ? melanoma skin cancer  ? Olecranon fracture, right, closed, initial encounter   ? Psoriasis   ? ?Past Surgical History:  ?Procedure Laterality Date  ? COLONOSCOPY  2017  ? Dr.Armbruster  ? DILATION AND CURETTAGE OF UTERUS  12/16/2005  ? ORIF ELBOW FRACTURE Right 12/19/2021  ? Procedure: OPEN REDUCTION INTERNAL FIXATION (ORIF) ELBOW/OLECRANON FRACTURE;  Surgeon: Leandrew Koyanagi, MD;  Location: [place];  Service: Orthopedics;  Laterality: Right;  ? POLYPECTOMY    ? ?Patient Active Problem List  ? Diagnosis Date Noted  ? Closed olecranon fracture, right, initial encounter 12/19/2021  ? ? ?REFERRING DIAG: S52.021A (ICD-10-CM) - Closed fracture of olecranon process of right ulna, initial encounter  ? ?THERAPY DIAG:  ?Stiffness of right elbow, not elsewhere classified ? ?Pain in right elbow ? ?Muscle weakness (generalized) ? ?Localized edema ? ?PERTINENT HISTORY: NA  ? ?PRECAUTIONS: Strengthening allowed after 01/30/2022 per MD referral ? ?SUBJECTIVE: Tiffany Lopez reports she is now able to get her "stud earrings on." ? ?PAIN:  ?Are you having pain? 0/10 ? ? ? ? ?OBJECTIVE:  ?  ? ?PATIENT SURVEYS:  ?01/18/22: FOTO 44 (Goal 71) ?02/19/22: FOTO 74 ?  ?                              ?UPPER EXTREMITY AROM/PROM: ?  ?A/PROM Right ?01/18/2022 Left ?01/18/2022 Right ?01/21/22 Right  ?01/24/22  Right 01/31/22 Right ?02/05/2022 Right ?02/07/22 Right 02/14/2022 Right  ?02/19/22 Right ?02/21/22 Right 02/28/22 Right 03/07/2022  ?Shoulder flexion                ?Shoulder extension                ?Shoulder abduction                ?Shoulder adduction                ?Shoulder internal rotation                ?Shoulder external rotation                ?Elbow flexion 77 150 P 91 P 99 A 93 In supine AROM 94  Standing  ?P 100 ?A 95 Active 102 degrees standing A 109 standing AA 113 Active in standing 117 Active in standing 124 degrees  ?Elbow extension -48 -7 P -22 P -20 A -31 In Supine AROM -25 ?PROM -22  Active -31 degrees standing A - 24 ?standing P -8 (after LLLD) Active in standing -23 Active in standing -23 ?  ?Wrist flexion                ?  Wrist extension                ?Wrist ulnar deviation                ?Wrist radial deviation                ?Wrist pronation                ?Wrist supination                ?(Blank rows = not tested) ?  ?UPPER EXTREMITY MMT:  ?  ?MMT Right ?02/05/2022 Left ?02/05/2022     ?Shoulder flexion         ?Shoulder extension         ?Shoulder abduction         ?Shoulder adduction         ?Shoulder internal rotation         ?Shoulder external rotation         ?Elbow flexion 17.5, 17.9 lbs in supine 80 deg flexion 28, 30 lb ?in supine 80 deg flexion     ?Elbow extension 9.7, 9.1 lbs in supine 80 deg flexion 16.2, 15.8 lbs ?in supine 80 deg flexion     ?Wrist flexion  5/5      ?Wrist extension  5/5      ?Wrist ulnar deviation         ?Wrist radial deviation         ?Wrist pronation  4/5       ?Wrist supination  4/5       ? ?02/21/22 Grip Strength:  Lt 45.6# ?     Rt 30.2# ? ?02/28/22 Grip strength (L/R in pounds): 51.1#/34.0# ? ?TODAY'S TREATMENT:  ?03/07/2022 ?Low load long duration stretching elbow flexion and extension (3# extension 5 + 3 minutes) and 2# flexion 5 minutes. ? ?Active elbow flexion and extension ROM 10X 10 seconds each direction (stretching) ? ?Manual overpressure applied with active  supine elbow flexion and extension AAROM 30X 10 seconds each with mobilization belt ? ?Hang from pull-up bar 1X 30 seconds ? ? ?02/28/22 ?Low load long duration stretching elbow flexion and extension (3# extension and flexion) 2X 3 minutes in each direction. ? ?Active elbow flexion and extension ROM 10X 10 seconds each direction (stretching) ? ?Manual overpressure applied with active supine elbow flexion and extension AAROM 20X 10 seconds each ? ?Pulley elbow flexion and extension 10X 10 seconds each ? ?UBE seat close for flexion and seat far for extension 4 minutes each (:30 push/:30 pull) ? ? ?02/26/22 ?Baseline measurements: AROM 25-0-108 ?LLLD stretch 4# 2 x 3 min, flexion and extension, rest breaks PRN between sets ?Manual: PROM with extended holds for flexion and extension ? ?Post-Treatment measurements: 10-0-121 ? ? ?PATIENT EDUCATION: ?02/14/2022: ?Education details: HEP review with emphasis on flexion and extension ?Person educated: Patient ?Education method: Explanation, Demonstration, Tactile cues, Verbal cues, and Handouts ?Education comprehension: verbalized understanding, returned demonstration, verbal cues required, tactile cues required, and needs further education ?  ?  ?HOME EXERCISE PROGRAM: ?Access Code: [email or web address] ?URL: [email or web address] ?Date: 02/05/2022 ?Prepared by: Scot Jun ? ?Exercises ?Elbow AROM Flexion/Extension Forearm in Neutral in Supine - 2-5 x daily - 7 x weekly - 1-3 sets - 10 reps - 10-15 seconds hold ?Elbow AROM Blocked Extension in Neutral Position - 3-5 x daily - 7 x weekly - 1-2 sets - 10 reps - 10-15 seconds hold ?Seated Forearm Supination Pronation - 2-3 x daily -  7 x weekly - 1 sets - 10 reps - 10 seconds hold ?Bicep Stretch at Table - 2-3 x daily - 7 x weekly - 1 sets - 1-2 reps - 20-30 sec hold ?Supine Elbow Flexion Extension AROM - 2-3 x daily - 7 x weekly - 1-2 sets - 1-2 reps - 2-5 min hold ?Forearm Pronation and Supination with Hammer (Mirrored) -  1-2 x daily - 7 x weekly - 1-2 sets - 10 reps ?Seated Isometric Elbow Flexion - 1-2 x daily - 7 x weekly - 1 sets - 5 reps - 10 hold ?Seated Isometric Elbow Extension - 1-2 x daily - 7 x weekly - 1 sets - 5 reps - 10 hold ? ?  ?ASSESSMENT: ?  ?CLINICAL IMPRESSION:  ?Tiffany Lopez continues to give great effort with her HEP.  AROM is tougher to gain at this point post-injury and AROM remains the focus of her home and clinic program.  AROM (elbow flexion and extension) remain the primary focus of her home and clinic programs.  Continue current plan with measurements to check objective progress weekly. ? ? ?REHAB POTENTIAL: Good ?  ?CLINICAL DECISION MAKING: Stable/uncomplicated ?  ?EVALUATION COMPLEXITY: Low ?  ?  ?GOALS: ?Goals reviewed with patient? Yes ?  ?SHORT TERM GOALS: ?  ?STG Name Target Date Goal status  ?1 Improve Rt elbow AROM to -30 to 105 degrees. ? 02/15/2022 MET  ?02/19/22  ?2 Tiffany Lopez will report independence and compliance with her day 1 HEP. ? 02/01/2022 MET  ?  ?LONG TERM GOALS:  ?  ?LTG Name Target Date Goal status  ?1 Improve FOTO to 71. ?Baseline: 44 03/15/2022 MET  ?2 [name] will report Rt elbow pain consistently < 3/10 on the VAS with ADLs. ? 03/15/2022 MET  ?3 Improve Rt elbow AROM to -20 to 135 degrees. ? 03/15/2022 On going ?02/28/22 ?-23 to 117  ?4 [name] will have Rt grip strength at 90% or better compared to the uninvolved L. ? 03/15/2022 On going ?66.5% 02/28/22  ?5 [name] will be independent with her long-term HEP at DC. ? 03/15/2022 On going ?Assessed 02/28/22  ?  ?PLAN: ?PT FREQUENCY:  2-3X/week ?  ?PT DURATION: 8 weeks ?  ?PLANNED INTERVENTIONS: Therapeutic exercises, Therapeutic activity, Neuro Muscular re-education, Patient/Family education, Joint mobilization, Cryotherapy, Moist heat, and Manual therapy ?  ?PLAN FOR NEXT SESSION:  ?Continue with elbow extension and flexion AROM focus to meet LTGs. ? ?Farley Ly PT, MPT ?03/07/22 8:45 AM ? ? ? ? ? ? ? ?  ? ?

## 2022-03-12 ENCOUNTER — Ambulatory Visit: Payer: 59 | Admitting: Physical Therapy

## 2022-03-12 ENCOUNTER — Encounter: Payer: Self-pay | Admitting: Physical Therapy

## 2022-03-12 ENCOUNTER — Other Ambulatory Visit: Payer: Self-pay

## 2022-03-12 DIAGNOSIS — M6281 Muscle weakness (generalized): Secondary | ICD-10-CM

## 2022-03-12 DIAGNOSIS — R600 Localized edema: Secondary | ICD-10-CM

## 2022-03-12 DIAGNOSIS — M25621 Stiffness of right elbow, not elsewhere classified: Secondary | ICD-10-CM | POA: Diagnosis not present

## 2022-03-12 DIAGNOSIS — M25521 Pain in right elbow: Secondary | ICD-10-CM | POA: Diagnosis not present

## 2022-03-12 NOTE — Therapy (Addendum)
OUTPATIENT PHYSICAL THERAPY TREATMENT NOTE DISCHARGE SUMMARY   Patient Name: Tiffany Lopez MRN: [id number] DOB:13-Feb-1961, 61 y.o., female Today's Date: 03/12/2022  PCP: Haywood Pao, MD REFERRING PROVIDER: Leandrew Koyanagi, MD    PT End of Session - 03/12/22 [phone]     Visit Number 15    Number of Visits 21    Date for PT Re-Evaluation 04/23/22    PT Start Time 0800    PT Stop Time 0830    PT Time Calculation (min) 30 min    Activity Tolerance Patient tolerated treatment well    Behavior During Therapy [facility] for tasks assessed/performed                         Past Medical History:  Diagnosis Date   Allergy    Cancer ([place])    melanoma skin cancer   Olecranon fracture, right, closed, initial encounter    Psoriasis    Past Surgical History:  Procedure Laterality Date   COLONOSCOPY  2017   Dr.Armbruster   DILATION AND CURETTAGE OF UTERUS  12/16/2005   ORIF ELBOW FRACTURE Right 12/19/2021   Procedure: OPEN REDUCTION INTERNAL FIXATION (ORIF) ELBOW/OLECRANON FRACTURE;  Surgeon: Leandrew Koyanagi, MD;  Location: [place];  Service: Orthopedics;  Laterality: Right;   POLYPECTOMY     Patient Active Problem List   Diagnosis Date Noted   Closed olecranon fracture, right, initial encounter 12/19/2021    REFERRING DIAG: Q22.297L (ICD-10-CM) - Closed fracture of olecranon process of right ulna, initial encounter   THERAPY DIAG:  Stiffness of right elbow, not elsewhere classified - Plan: PT plan of care cert/re-cert  Pain in right elbow - Plan: PT plan of care cert/re-cert  Muscle weakness (generalized) - Plan: PT plan of care cert/re-cert  Localized edema - Plan: PT plan of care cert/re-cert  PERTINENT HISTORY: NA   PRECAUTIONS: Strengthening allowed after 01/30/2022 per MD referral  SUBJECTIVE: Feels like she's able to use arm more, shoulder is a little more sore from compensating   PAIN:  Are you having pain? 0/10     OBJECTIVE:      PATIENT SURVEYS:  01/18/22: FOTO 44 (Goal 71) 02/19/22: FOTO 74                                 UPPER EXTREMITY AROM/PROM:   A/PROM Right 01/18/2022 Left 01/18/2022 Right 01/21/22 Right  01/24/22 Right 01/31/22 Right 02/05/2022 Right 02/07/22 Right 02/14/2022 Right  02/19/22 Right 02/21/22 Right 02/28/22 Right 03/07/2022 Right 03/12/22  Shoulder flexion                 Shoulder extension                 Shoulder abduction                 Shoulder adduction                 Shoulder internal rotation                 Shoulder external rotation                 Elbow flexion 77 150 P 91 P 99 A 93 In supine AROM 94  Standing  P 100 A 95 Active 102 degrees standing A 109 standing AA 113 Active in standing 117 Active in standing 124 degrees  Active supine 127 Passive supine 129  Elbow extension -48 -7 P -22 P -20 A -31 In Supine AROM -25 PROM -22  Active -31 degrees standing A - 24 standing P -8 (after LLLD) Active in standing -23 Active in standing -23  Active supine -17 Passive supine -9  Wrist flexion                 Wrist extension                 Wrist ulnar deviation                 Wrist radial deviation                 Wrist pronation                 Wrist supination                 (Blank rows = not tested)   UPPER EXTREMITY MMT:    MMT Right 02/05/2022 Left 02/05/2022     Shoulder flexion         Shoulder extension         Shoulder abduction         Shoulder adduction         Shoulder internal rotation         Shoulder external rotation         Elbow flexion 17.5, 17.9 lbs in supine 80 deg flexion 28, 30 lb in supine 80 deg flexion     Elbow extension 9.7, 9.1 lbs in supine 80 deg flexion 16.2, 15.8 lbs in supine 80 deg flexion     Wrist flexion  5/5      Wrist extension  5/5      Wrist ulnar deviation         Wrist radial deviation         Wrist pronation  4/5       Wrist supination  4/5         Grip Strength:  02/21/22 Lt 45.6#       Rt 30.2#    03/12/22 Lt  53.3#      Rt:  38.2#   02/28/22 Grip strength (L/R in pounds): 51.1#/34.0#  TODAY'S TREATMENT:  03/12/22 LLLD stretches with 3# weight, 3 min flexion, 3 min ext AROM flexion/extension  Manual Rt elbow PROM with STM flexion and extension  03/07/2022 Low load long duration stretching elbow flexion and extension (3# extension 5 + 3 minutes) and 2# flexion 5 minutes.  Active elbow flexion and extension ROM 10X 10 seconds each direction (stretching)  Manual overpressure applied with active supine elbow flexion and extension AAROM 30X 10 seconds each with mobilization belt  Hang from pull-up bar 1X 30 seconds   02/28/22 Low load long duration stretching elbow flexion and extension (3# extension and flexion) 2X 3 minutes in each direction.  Active elbow flexion and extension ROM 10X 10 seconds each direction (stretching)  Manual overpressure applied with active supine elbow flexion and extension AAROM 20X 10 seconds each  Pulley elbow flexion and extension 10X 10 seconds each  UBE seat close for flexion and seat far for extension 4 minutes each (:30 push/:30 pull)   PATIENT EDUCATION: 02/14/2022: Education details: HEP review with emphasis on flexion and extension Person educated: Patient Education method: Explanation, Demonstration, Tactile cues, Verbal cues, and Handouts Education comprehension: verbalized understanding, returned demonstration, verbal cues required, tactile cues required, and needs  further education     HOME EXERCISE PROGRAM: Access Code: [email or web address] URL: [email or web address] Date: 02/05/2022 Prepared by: Scot Jun  Exercises Elbow AROM Flexion/Extension Forearm in Neutral in Supine - 2-5 x daily - 7 x weekly - 1-3 sets - 10 reps - 10-15 seconds hold Elbow AROM Blocked Extension in Neutral Position - 3-5 x daily - 7 x weekly - 1-2 sets - 10 reps - 10-15 seconds hold Seated Forearm Supination Pronation - 2-3 x daily - 7 x weekly - 1 sets - 10 reps - 10  seconds hold Bicep Stretch at Table - 2-3 x daily - 7 x weekly - 1 sets - 1-2 reps - 20-30 sec hold Supine Elbow Flexion Extension AROM - 2-3 x daily - 7 x weekly - 1-2 sets - 1-2 reps - 2-5 min hold Forearm Pronation and Supination with Hammer (Mirrored) - 1-2 x daily - 7 x weekly - 1-2 sets - 10 reps Seated Isometric Elbow Flexion - 1-2 x daily - 7 x weekly - 1 sets - 5 reps - 10 hold Seated Isometric Elbow Extension - 1-2 x daily - 7 x weekly - 1 sets - 5 reps - 10 hold    ASSESSMENT:   CLINICAL IMPRESSION:  Pt has met 3/5 LTGs and demonstrated progress towards other 2 goals.  At this time she has extensive HEP to address ROM and strength deficits, and is consistent with HEP.  Feel most remaining goals can be addressed through HEP, but will recert for up to 1x/wk for up to 6 weeks in case she needs to return to PT.     REHAB POTENTIAL: Good   CLINICAL DECISION MAKING: Stable/uncomplicated   EVALUATION COMPLEXITY: Low     GOALS: Goals reviewed with patient? Yes   SHORT TERM GOALS:   STG Name Target Date Goal status  1 Improve Rt elbow AROM to -30 to 105 degrees.  02/15/2022 MET  02/19/22  2 Tiffany Lopez will report independence and compliance with her day 1 HEP.  02/01/2022 MET    LONG TERM GOALS:    LTG Name Target Date Goal status  1 Improve FOTO to 51. Baseline: 44 03/15/2022 MET  2 Tiffany Lopez will report Rt elbow pain consistently < 3/10 on the VAS with ADLs.  03/15/2022 MET  3 Improve Rt elbow AROM to -20 to 135 degrees.  04/23/22  On going 03/12/22  Tiffany Lopez will have Rt grip strength at 90% or better compared to the uninvolved L.  04/23/22 On going 03/12/22  Tiffany Lopez will be independent with her long-term HEP at DC.  03/15/2022 MET 03/12/22    PLAN: PT FREQUENCY:  2-3X/week   PT DURATION: 8 weeks   PLANNED INTERVENTIONS: Therapeutic exercises, Therapeutic activity, Neuro Muscular re-education, Patient/Family education, Joint mobilization, Cryotherapy, Moist heat, and Manual therapy    PLAN FOR NEXT SESSION:  Recert for pt to return if needed; otherwise transition to [place], PT, DPT 03/12/22 8:41 AM       PHYSICAL THERAPY DISCHARGE SUMMARY  Visits from Start of Care: 15  Current functional level related to goals / functional outcomes: See above   Remaining deficits: See above   Education / Equipment: HEP   Patient agrees to discharge. Patient goals were partially met. Patient is being discharged due to being pleased with the current functional level.   Laureen Abrahams, PT, DPT 05/15/22 11:27 AM  [facility] [address] [place], [place], [address] Phone:  [phone]   Fax:  [phone]

## 2022-03-13 ENCOUNTER — Other Ambulatory Visit (HOSPITAL_COMMUNITY): Payer: Self-pay

## 2022-03-14 ENCOUNTER — Other Ambulatory Visit (HOSPITAL_COMMUNITY): Payer: Self-pay

## 2022-03-14 ENCOUNTER — Encounter: Payer: 59 | Admitting: Physical Therapy

## 2022-03-21 ENCOUNTER — Ambulatory Visit: Payer: 59 | Admitting: Orthopaedic Surgery

## 2022-03-21 ENCOUNTER — Ambulatory Visit (INDEPENDENT_AMBULATORY_CARE_PROVIDER_SITE_OTHER): Payer: 59

## 2022-03-21 ENCOUNTER — Encounter: Payer: Self-pay | Admitting: Orthopaedic Surgery

## 2022-03-21 DIAGNOSIS — S52021A Displaced fracture of olecranon process without intraarticular extension of right ulna, initial encounter for closed fracture: Secondary | ICD-10-CM | POA: Diagnosis not present

## 2022-03-21 DIAGNOSIS — M25511 Pain in right shoulder: Secondary | ICD-10-CM | POA: Diagnosis not present

## 2022-03-21 DIAGNOSIS — G8929 Other chronic pain: Secondary | ICD-10-CM | POA: Diagnosis not present

## 2022-03-21 NOTE — Progress Notes (Signed)
? ?Post-Op Visit Note ?  ?Patient: Tiffany Lopez           ?Date of Birth: 28-Dec-1960           ?MRN: [id number] ?Visit Date: 03/21/2022 ?PCP: Haywood Pao, MD ? ? ?Assessment & Plan: ? ?Chief Complaint:  ?Chief Complaint  ?Patient presents with  ? Right Elbow - Pain  ? ?Visit Diagnoses:  ?1. Closed fracture of olecranon process of right ulna, initial encounter   ?2. Chronic right shoulder pain   ? ? ?Plan: Tiffany Lopez is 3 months status post ORIF right olecranon fracture on 12/19/2021.  She has completed physical therapy and doing exercises on her own.  She is also complaining of some right shoulder stiffness and symptoms since the injury to the elbow.  She denies any specific injuries to the shoulder.  She has noticed some restriction in range of motion. ? ?Examination of right elbow shows full healed surgical scar.  She has functional range of motion of the elbow has improved significantly is compared to the prior visit. ?Examination of the right shoulder shows mild restriction in flexion abduction external rotation and internal rotation.  Her internal rotation is restricted to the back pocket.  Manual muscle testing of the cuff is normal. ? ?In regards to the elbow her fracture has fully consolidated.  She will continue to do home exercises to improve range of motion.  She is able to get to almost 130 degrees of flexion.  In regards to the right shoulder she is presenting with mild adhesive capsulitis.  I recommended cortisone injection which she agreed to.  We will get her set up with Dr. Ernestina Patches for this.  We provided exercises for the shoulder today.  If she continues to have problems with the shoulder may need to obtain MRI to rule out structural abnormalities.  Otherwise we will see her back as needed. ? ?Follow-Up Instructions: No follow-ups on file.  ? ?Orders:  ?Orders Placed This Encounter  ?Procedures  ? XR Elbow 2 Views Right  ? Ambulatory referral to Physical Medicine Rehab  ? ?No orders of the  defined types were placed in this encounter. ? ? ?Imaging: ?XR Elbow 2 Views Right ? ?Result Date: 03/21/2022 ?Healed olecranon fracture without hardware complication  ? ?PMFS History: ?Patient Active Problem List  ? Diagnosis Date Noted  ? Closed olecranon fracture, right, initial encounter 12/19/2021  ? ?Past Medical History:  ?Diagnosis Date  ? Allergy   ? Cancer [facility])   ? melanoma skin cancer  ? Olecranon fracture, right, closed, initial encounter   ? Psoriasis   ?  ?Family History  ?Problem Relation Age of Onset  ? Colon cancer Paternal Grandmother   ? Esophageal cancer Neg Hx   ? Stomach cancer Neg Hx   ? Rectal cancer Neg Hx   ?  ?Past Surgical History:  ?Procedure Laterality Date  ? COLONOSCOPY  2017  ? Dr.Armbruster  ? DILATION AND CURETTAGE OF UTERUS  12/16/2005  ? ORIF ELBOW FRACTURE Right 12/19/2021  ? Procedure: OPEN REDUCTION INTERNAL FIXATION (ORIF) ELBOW/OLECRANON FRACTURE;  Surgeon: Leandrew Koyanagi, MD;  Location: [place];  Service: Orthopedics;  Laterality: Right;  ? POLYPECTOMY    ? ?Social History  ? ?Occupational History  ? Not on file  ?Tobacco Use  ? Smoking status: Never  ? Smokeless tobacco: Never  ?Vaping Use  ? Vaping Use: Never used  ?Substance and Sexual Activity  ? Alcohol use: Not Currently  ?  Comment: rare  ? Drug use: No  ? Sexual activity: Yes  ?  Birth control/protection: Post-menopausal  ? ? ? ?

## 2022-03-25 ENCOUNTER — Telehealth: Payer: Self-pay | Admitting: Physical Medicine and Rehabilitation

## 2022-04-16 ENCOUNTER — Encounter: Payer: Self-pay | Admitting: Physical Medicine and Rehabilitation

## 2022-04-16 ENCOUNTER — Ambulatory Visit: Payer: Self-pay

## 2022-04-16 ENCOUNTER — Ambulatory Visit: Payer: 59 | Admitting: Physical Medicine and Rehabilitation

## 2022-04-16 DIAGNOSIS — M25511 Pain in right shoulder: Secondary | ICD-10-CM

## 2022-04-16 DIAGNOSIS — G8929 Other chronic pain: Secondary | ICD-10-CM | POA: Diagnosis not present

## 2022-04-16 NOTE — Progress Notes (Signed)
Pt state right shoulder pain. Pt state any movement with her right arm makes the pain worse. Pt state she doesn't take anything for the pain.  Numeric Pain Rating Scale and Functional Assessment Average Pain 1   In the last MONTH (on 0-10 scale) has pain interfered with the following?  1. General activity like being  able to carry out your everyday physical activities such as walking, climbing stairs, carrying groceries, or moving a chair?  Rating(1)   +Driver, -BT, -Dye Allergies.

## 2022-04-16 NOTE — Progress Notes (Signed)
   Tiffany Lopez - 61 y.o. female MRN [id number]  Date of birth: 1961-03-07  Office Visit Note: Visit Date: 04/16/2022 PCP: Haywood Pao, MD Referred by: Haywood Pao, MD  Subjective: Chief Complaint  Patient presents with   Right Shoulder - Pain   HPI:  Tiffany Lopez is a 61 y.o. female who comes in today at the request of Dr. Eduard Roux for planned Right anesthetic glenohumeral arthrogram with fluoroscopic guidance.  The patient has failed conservative care including home exercise, medications, time and activity modification.  This injection will be diagnostic and hopefully therapeutic.  Please see requesting physician notes for further details and justification.   ROS Otherwise per HPI.  Assessment & Plan: Visit Diagnoses:    ICD-10-CM   1. Chronic right shoulder pain  M25.511 Large Joint Inj: R glenohumeral   G89.29 XR C-ARM NO REPORT      Plan: No additional findings.   Meds & Orders: No orders of the defined types were placed in this encounter.   Orders Placed This Encounter  Procedures   Large Joint Inj: R glenohumeral   XR C-ARM NO REPORT    Follow-up: No follow-ups on file.   Procedures: Large Joint Inj: R glenohumeral on 04/16/2022 10:27 AM Indications: pain and diagnostic evaluation Details: 22 G 3.5 in needle, fluoroscopy-guided anteromedial approach  Arthrogram: No  Medications: 40 mg triamcinolone acetonide 40 MG/ML; 5 mL bupivacaine 0.25 % Outcome: tolerated well, no immediate complications  There was excellent flow of contrast producing a partial arthrogram of the glenohumeral joint. The patient did have relief of symptoms during the anesthetic phase of the injection. Procedure, treatment alternatives, risks and benefits explained, specific risks discussed. Consent was given by the patient. Immediately prior to procedure a time out was called to verify the correct patient, procedure, equipment, support staff and site/side marked as required.  Patient was prepped and draped in the usual sterile fashion.         Clinical History: No specialty comments available.     Objective:  VS:  HT:    WT:   BMI:     BP:   HR: bpm  TEMP: ( )  RESP:  Physical Exam   Imaging: No results found.

## 2022-05-06 MED ORDER — TRIAMCINOLONE ACETONIDE 40 MG/ML IJ SUSP
40.0000 mg | INTRAMUSCULAR | Status: AC | PRN
  Administered 2022-04-16: 40 mg via INTRA_ARTICULAR

## 2022-05-06 MED ORDER — BUPIVACAINE HCL 0.25 % IJ SOLN
5.0000 mL | INTRAMUSCULAR | Status: AC | PRN
  Administered 2022-04-16: 5 mL via INTRA_ARTICULAR

## 2022-06-03 ENCOUNTER — Other Ambulatory Visit: Payer: Self-pay | Admitting: Internal Medicine

## 2022-06-03 DIAGNOSIS — Z1231 Encounter for screening mammogram for malignant neoplasm of breast: Secondary | ICD-10-CM

## 2022-06-28 ENCOUNTER — Ambulatory Visit
Admission: RE | Admit: 2022-06-28 | Discharge: 2022-06-28 | Disposition: A | Discharge status: Home / Self Care | Payer: 59 | Source: Ambulatory Visit | Attending: Internal Medicine | Admitting: Internal Medicine

## 2022-06-28 DIAGNOSIS — Z1231 Encounter for screening mammogram for malignant neoplasm of breast: Secondary | ICD-10-CM | POA: Diagnosis not present

## 2022-07-01 ENCOUNTER — Other Ambulatory Visit: Payer: Self-pay | Admitting: Internal Medicine

## 2022-07-01 DIAGNOSIS — R928 Other abnormal and inconclusive findings on diagnostic imaging of breast: Secondary | ICD-10-CM

## 2022-07-05 ENCOUNTER — Ambulatory Visit
Admission: RE | Admit: 2022-07-05 | Discharge: 2022-07-05 | Disposition: A | Discharge status: Home / Self Care | Payer: 59 | Source: Ambulatory Visit | Attending: Internal Medicine | Admitting: Internal Medicine

## 2022-07-05 ENCOUNTER — Ambulatory Visit: Payer: 59

## 2022-07-05 DIAGNOSIS — R928 Other abnormal and inconclusive findings on diagnostic imaging of breast: Secondary | ICD-10-CM

## 2022-07-05 DIAGNOSIS — R922 Inconclusive mammogram: Secondary | ICD-10-CM | POA: Diagnosis not present

## 2022-08-02 DIAGNOSIS — H5203 Hypermetropia, bilateral: Secondary | ICD-10-CM | POA: Diagnosis not present

## 2022-08-02 DIAGNOSIS — H52223 Regular astigmatism, bilateral: Secondary | ICD-10-CM | POA: Diagnosis not present

## 2022-08-09 DIAGNOSIS — Z0142 Encounter for cervical smear to confirm findings of recent normal smear following initial abnormal smear: Secondary | ICD-10-CM | POA: Diagnosis not present

## 2022-08-09 DIAGNOSIS — M810 Age-related osteoporosis without current pathological fracture: Secondary | ICD-10-CM | POA: Diagnosis not present

## 2022-08-09 DIAGNOSIS — Z124 Encounter for screening for malignant neoplasm of cervix: Secondary | ICD-10-CM | POA: Diagnosis not present

## 2022-08-09 DIAGNOSIS — Z01419 Encounter for gynecological examination (general) (routine) without abnormal findings: Secondary | ICD-10-CM | POA: Diagnosis not present

## 2022-08-09 DIAGNOSIS — Z113 Encounter for screening for infections with a predominantly sexual mode of transmission: Secondary | ICD-10-CM | POA: Diagnosis not present

## 2022-08-09 DIAGNOSIS — Z01411 Encounter for gynecological examination (general) (routine) with abnormal findings: Secondary | ICD-10-CM | POA: Diagnosis not present

## 2022-08-09 DIAGNOSIS — Z6820 Body mass index (BMI) 20.0-20.9, adult: Secondary | ICD-10-CM | POA: Diagnosis not present

## 2022-09-17 DIAGNOSIS — Z85820 Personal history of malignant melanoma of skin: Secondary | ICD-10-CM | POA: Diagnosis not present

## 2022-09-17 DIAGNOSIS — D1801 Hemangioma of skin and subcutaneous tissue: Secondary | ICD-10-CM | POA: Diagnosis not present

## 2022-09-17 DIAGNOSIS — L720 Epidermal cyst: Secondary | ICD-10-CM | POA: Diagnosis not present

## 2022-09-17 DIAGNOSIS — L812 Freckles: Secondary | ICD-10-CM | POA: Diagnosis not present

## 2022-09-17 DIAGNOSIS — Z85828 Personal history of other malignant neoplasm of skin: Secondary | ICD-10-CM | POA: Diagnosis not present

## 2022-09-17 DIAGNOSIS — L821 Other seborrheic keratosis: Secondary | ICD-10-CM | POA: Diagnosis not present

## 2023-01-10 DIAGNOSIS — R7989 Other specified abnormal findings of blood chemistry: Secondary | ICD-10-CM | POA: Diagnosis not present

## 2023-01-10 DIAGNOSIS — E7800 Pure hypercholesterolemia, unspecified: Secondary | ICD-10-CM | POA: Diagnosis not present

## 2023-01-10 DIAGNOSIS — M810 Age-related osteoporosis without current pathological fracture: Secondary | ICD-10-CM | POA: Diagnosis not present

## 2023-01-17 DIAGNOSIS — R82998 Other abnormal findings in urine: Secondary | ICD-10-CM | POA: Diagnosis not present

## 2023-02-05 ENCOUNTER — Other Ambulatory Visit (HOSPITAL_COMMUNITY): Payer: Self-pay

## 2023-02-05 MED ORDER — FLUOCINONIDE 0.05 % EX SOLN
1.0000 | Freq: Two times a day (BID) | CUTANEOUS | 0 refills | Status: AC
Start: 2023-02-05 — End: ?
  Filled 2023-02-05: qty 60, 30d supply, fill #0

## 2023-02-11 IMAGING — CR DG NASAL BONES 3+V
4 series · 4 of 4 positions shown · non-contrast
Comparison: None

CLINICAL DATA: Status post fall. Laceration and pain to nasal
bridge.

EXAM:
NASAL BONES - 3+ VIEW

[[person_name]]
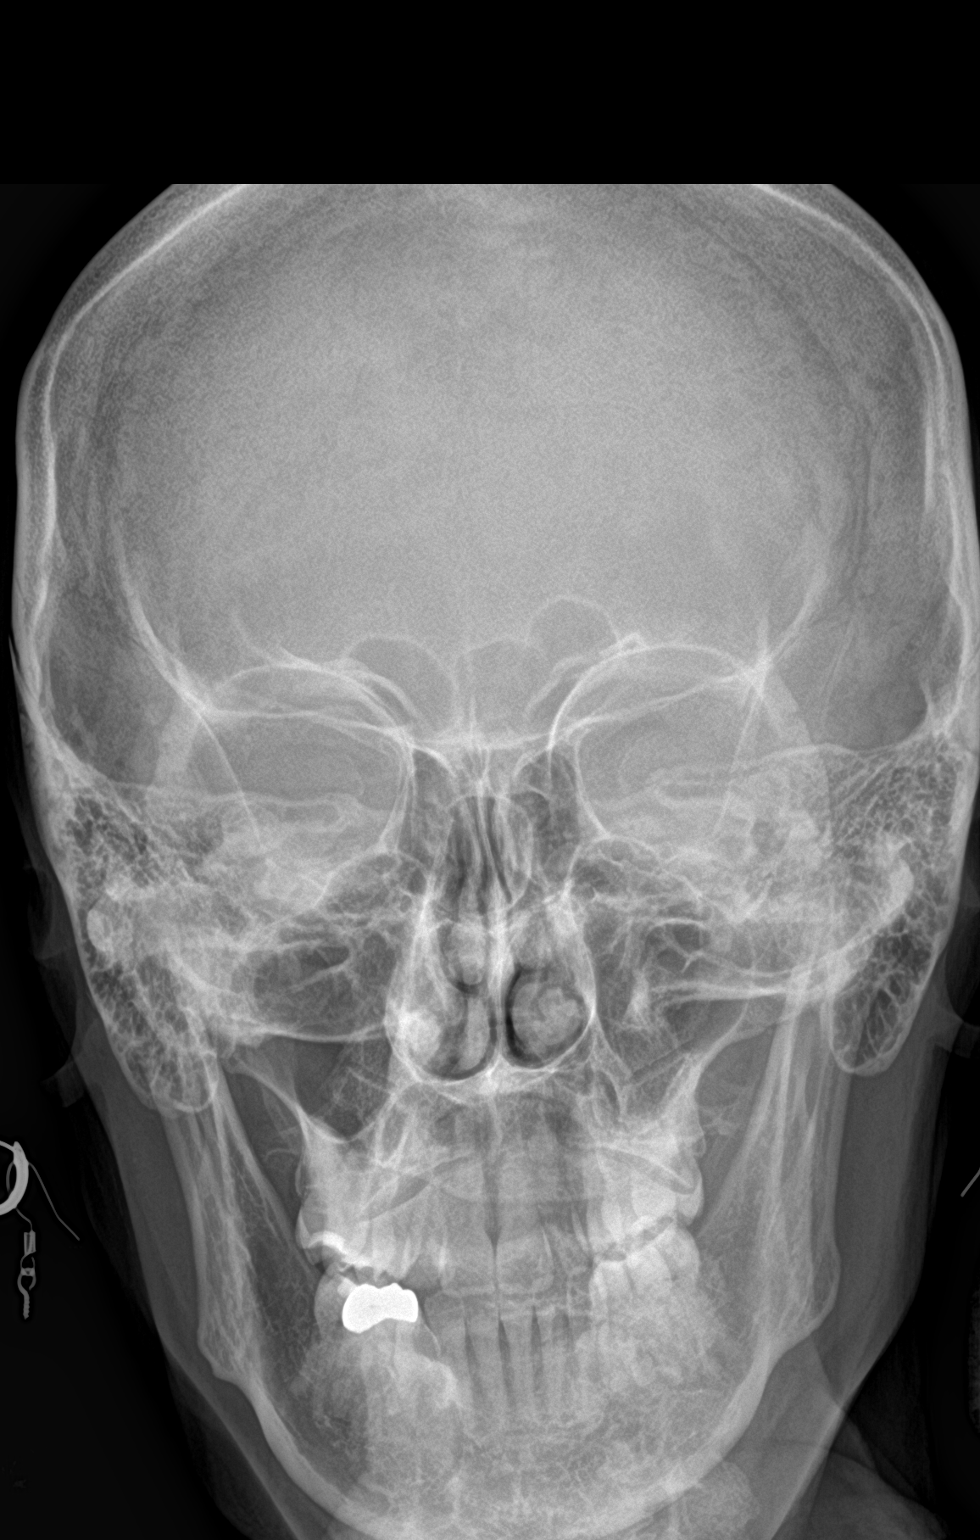

[nasal waters]
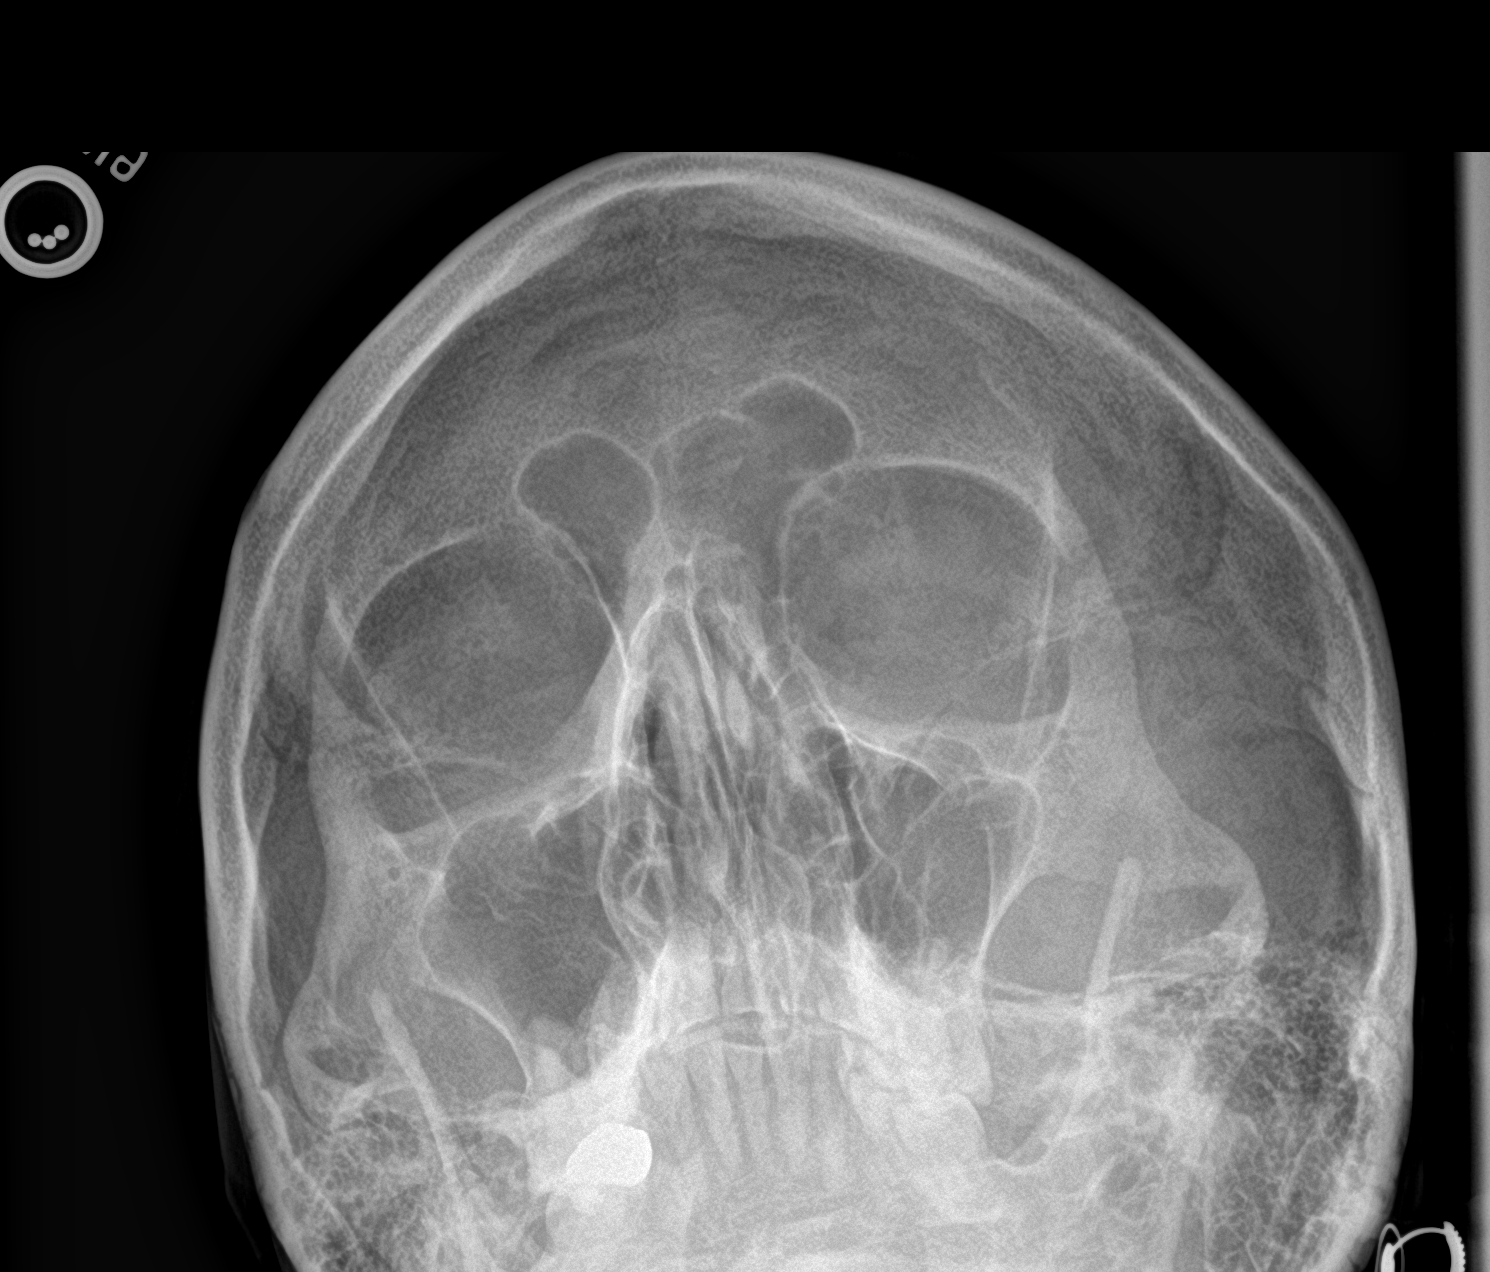

[nasal lat (1 of 2)]
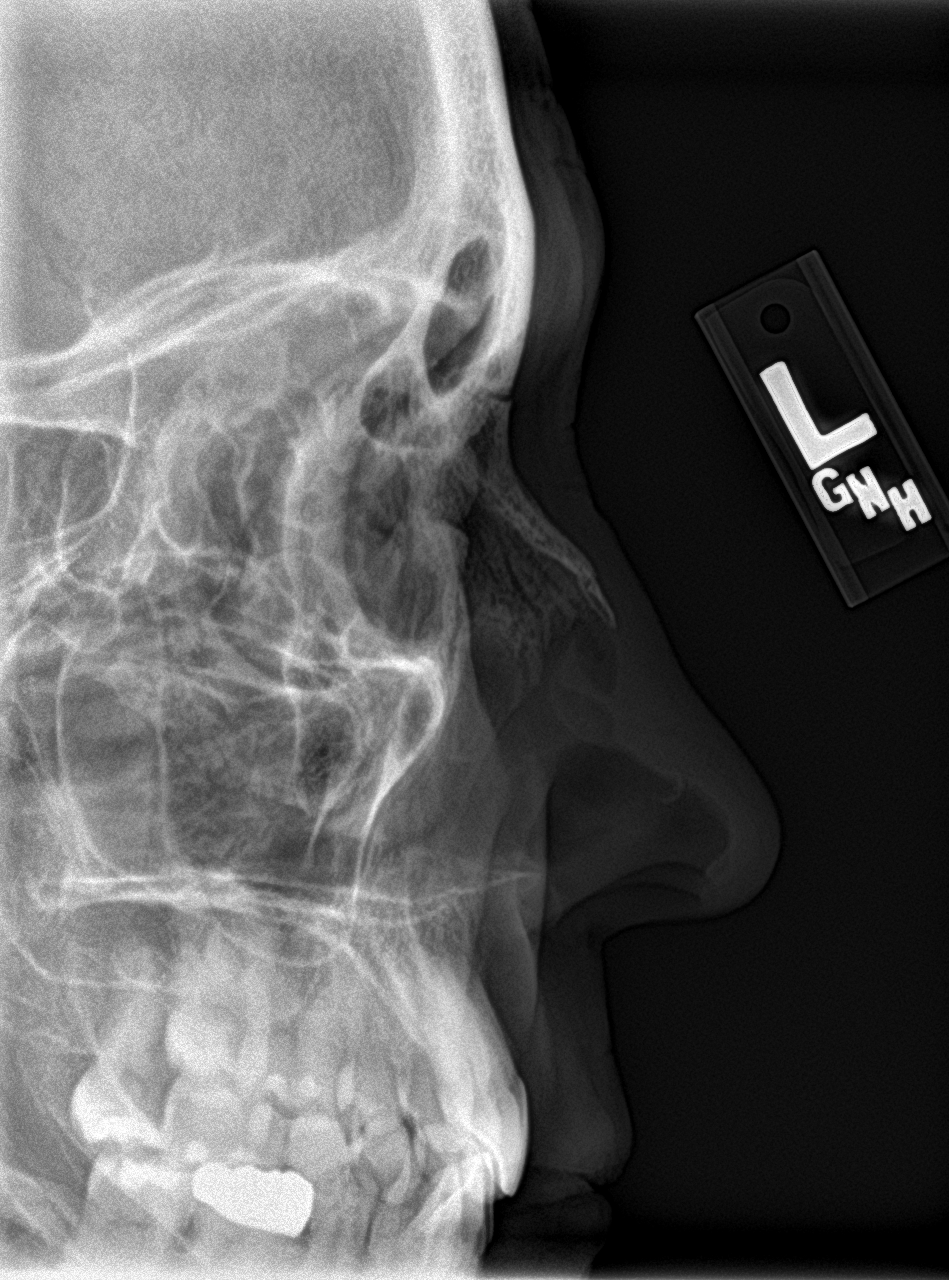

[nasal lat (2 of 2)]
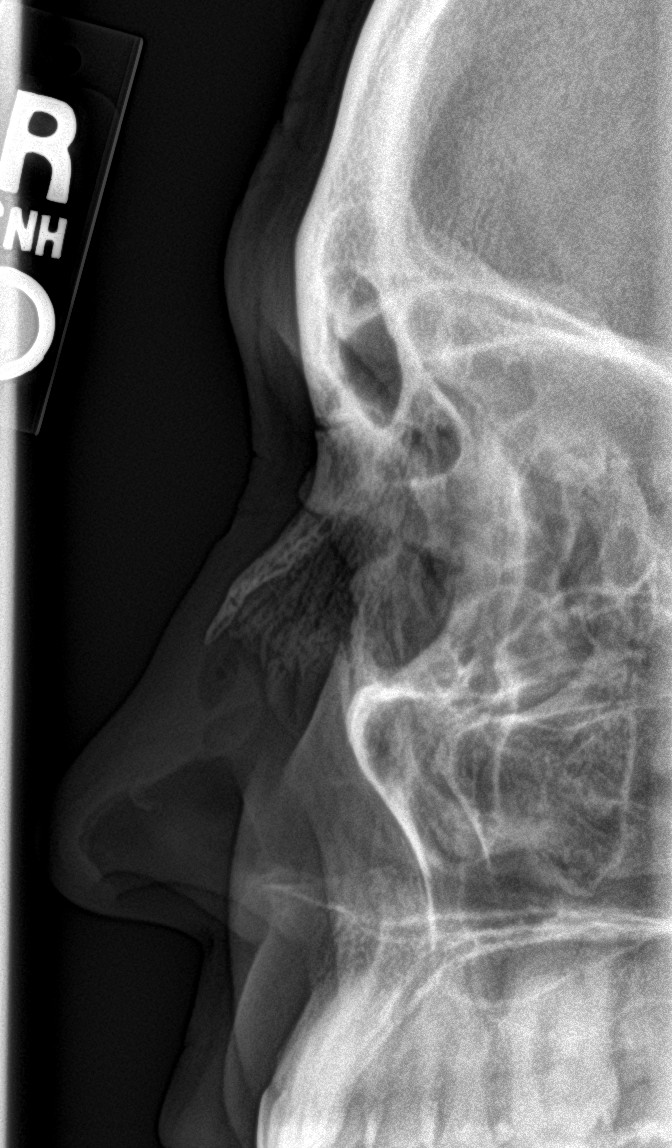

[4 of 4 positions shown; findings below may reference images not displayed]

FINDINGS: There is a nondisplaced fracture deformity identified at the base of
the nasal bone. No additional fractures identified. The paranasal
sinuses appear well aerated.
IMPRESSION: Nondisplaced fracture involves the base of the nasal bone.

## 2023-02-11 IMAGING — CR DG ELBOW COMPLETE 3+V*R*
4 series · 4 of 4 positions shown · non-contrast
Comparison: None.

CLINICAL DATA: Status post fall, elbow pain

EXAM:
RIGHT ELBOW - COMPLETE 3+ VIEW

[elbow ap]
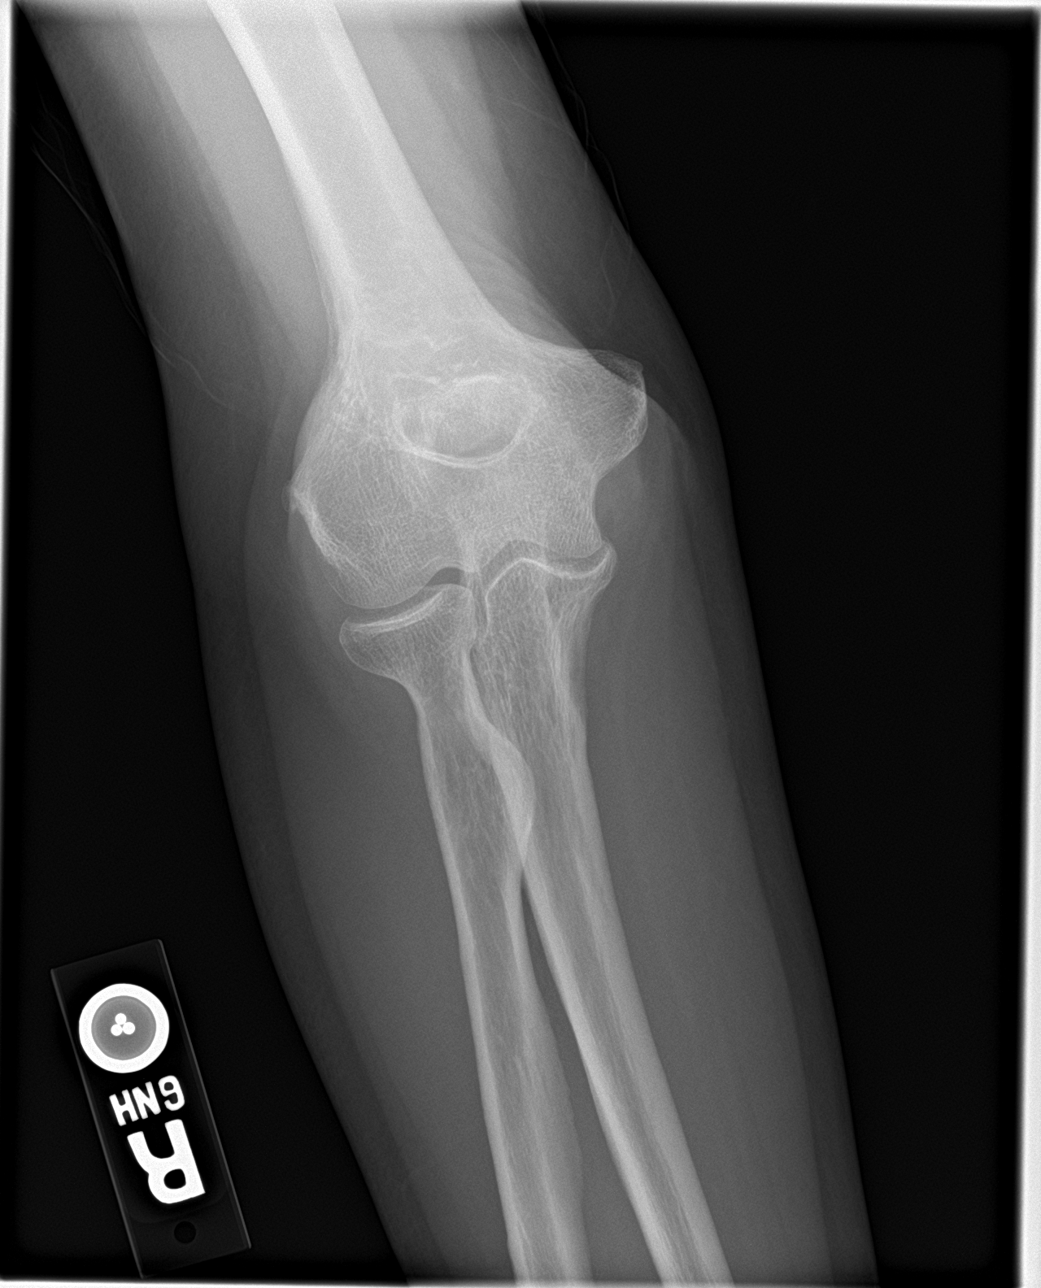

[elbow obl (1 of 2)]
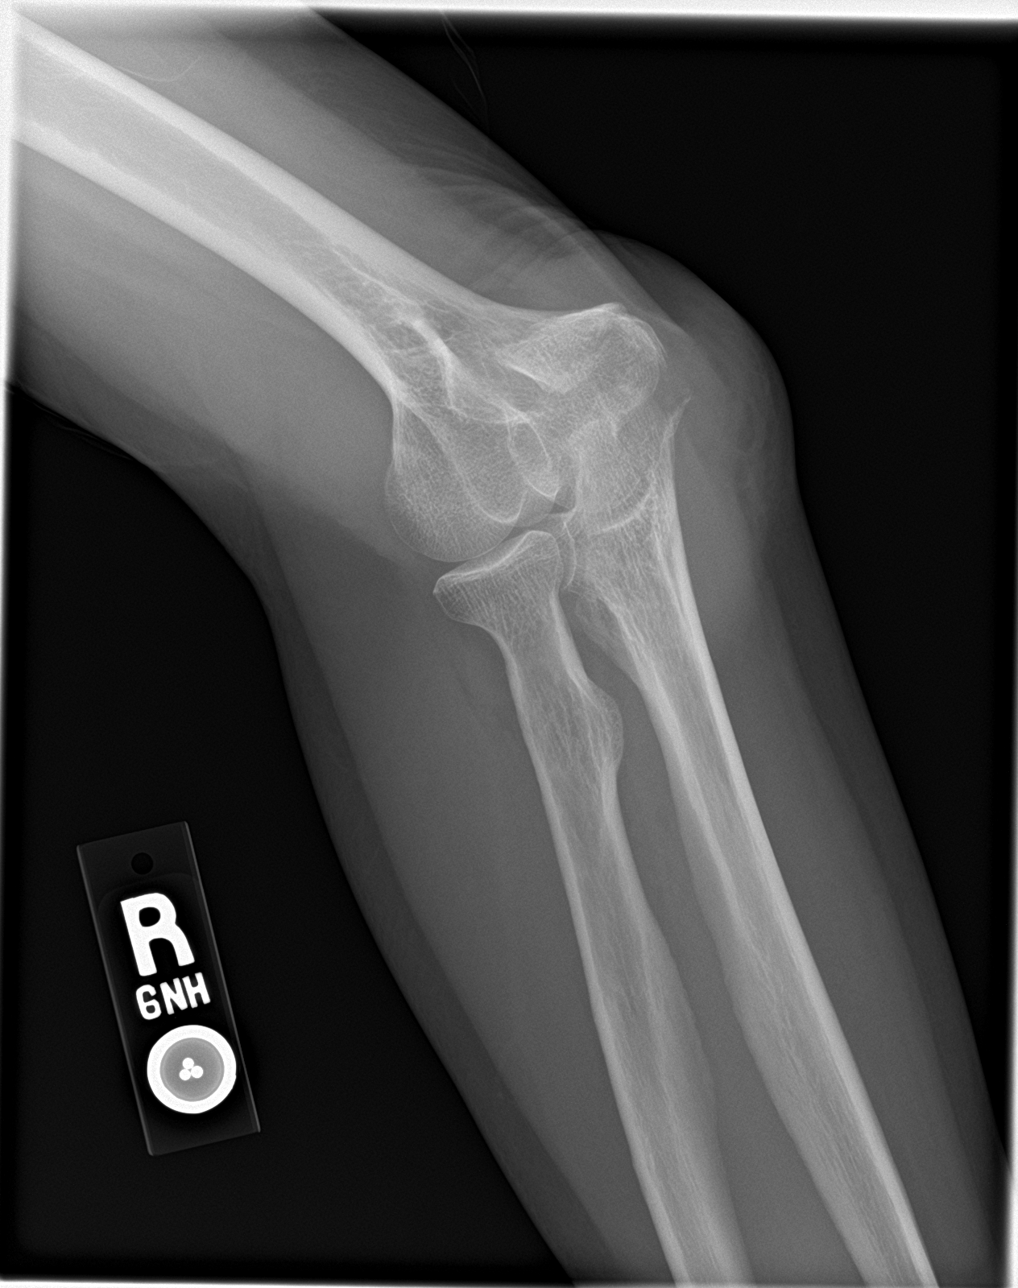

[elbow obl (2 of 2)]
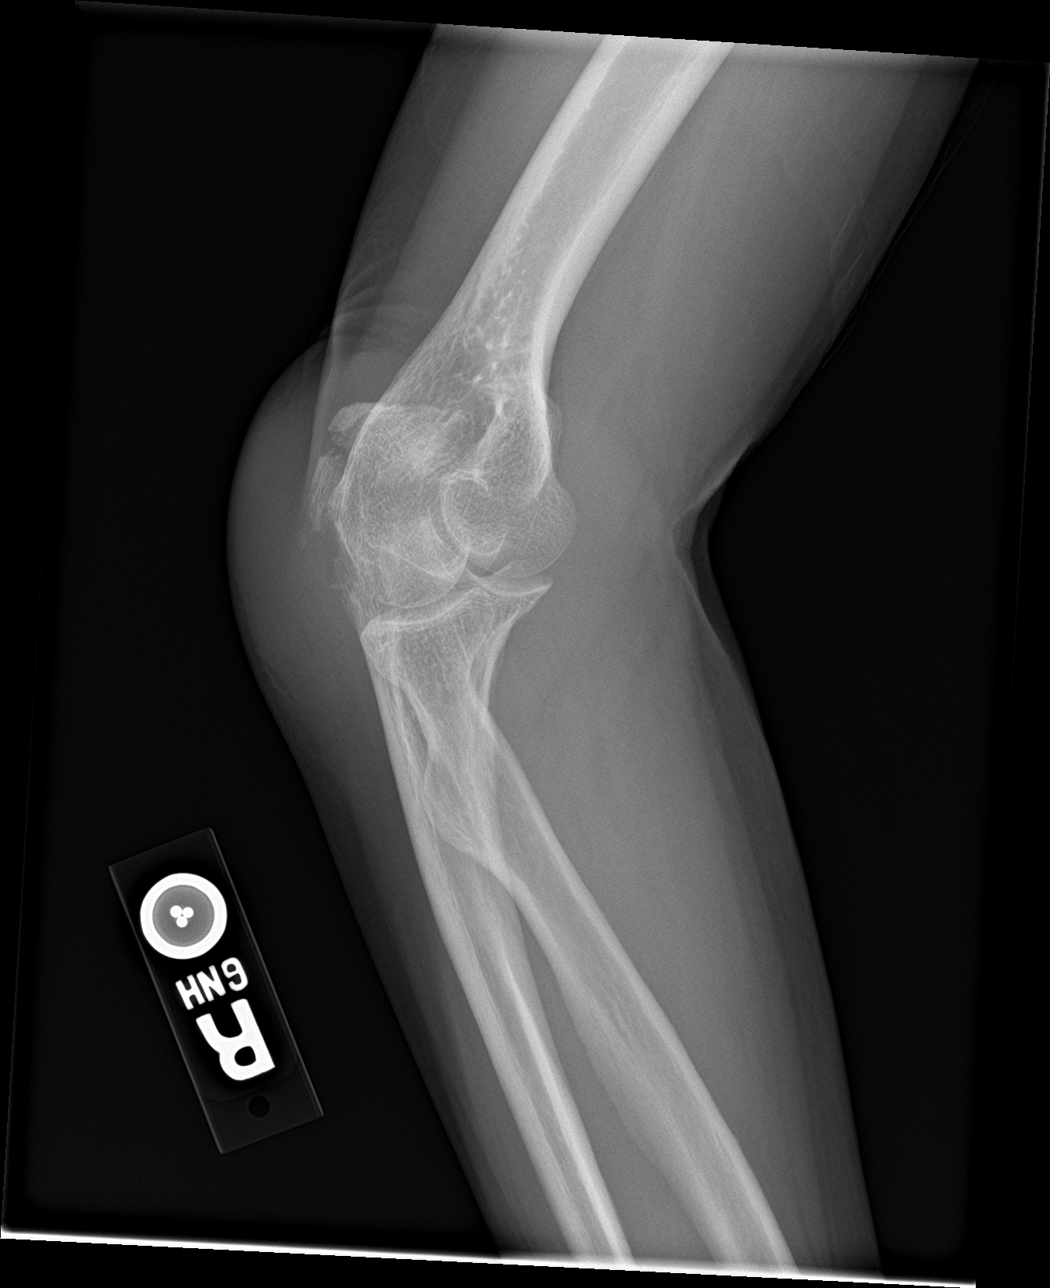

[elbow lat]
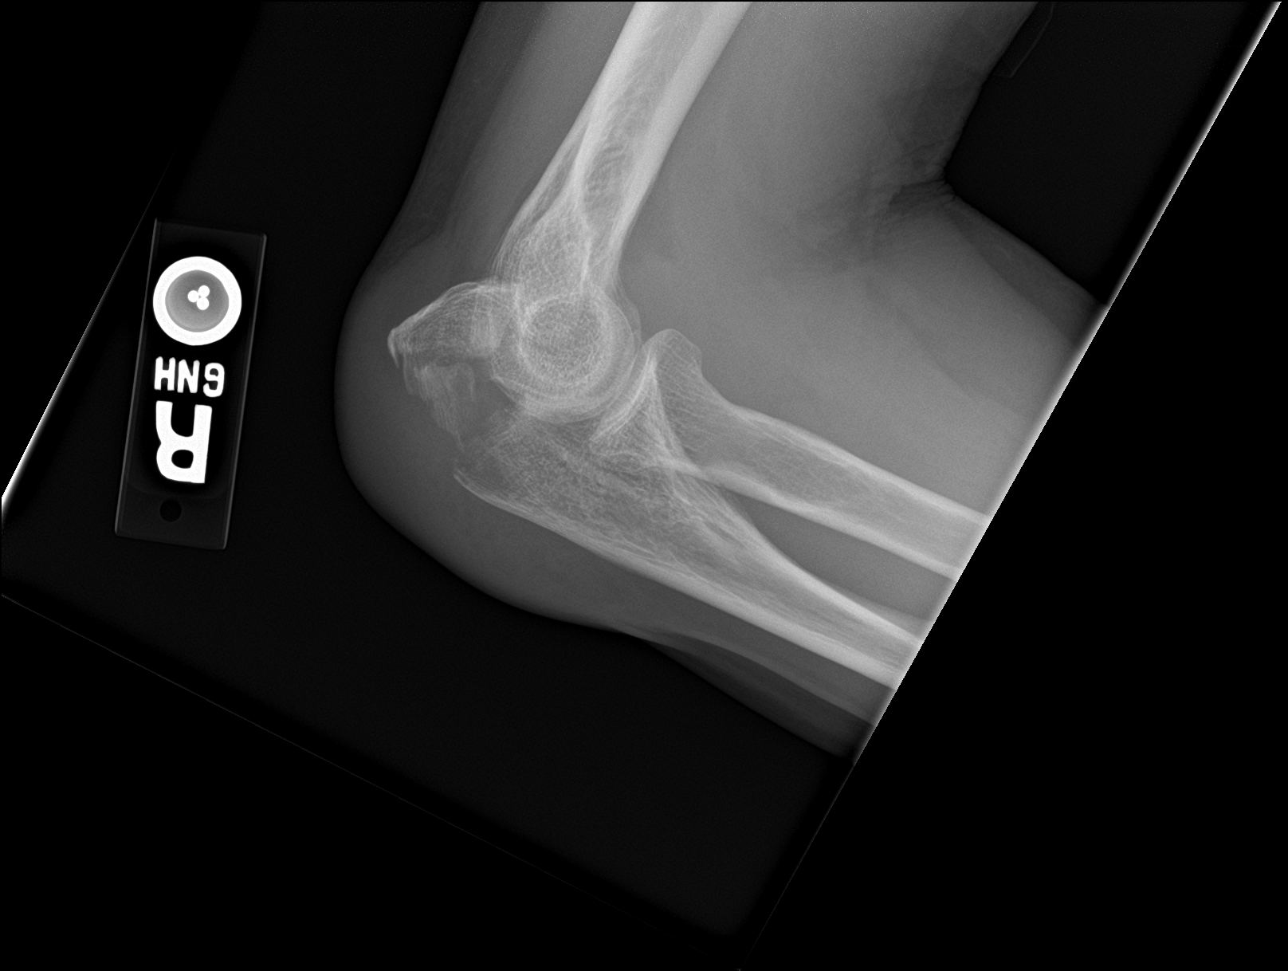

[4 of 4 positions shown; findings below may reference images not displayed]

FINDINGS: Comminuted fracture of the olecranon process with 19 mm of
distraction between the major fracture fragments. Severe overlying
soft tissue swelling. Elbow joint effusion. No other fracture or
dislocation.
IMPRESSION: 1. Comminuted fracture of the olecranon process with 19 mm of
distraction between the major fracture fragments. Severe overlying
soft tissue swelling.

## 2023-04-09 DIAGNOSIS — D225 Melanocytic nevi of trunk: Secondary | ICD-10-CM | POA: Diagnosis not present

## 2023-04-09 DIAGNOSIS — L308 Other specified dermatitis: Secondary | ICD-10-CM | POA: Diagnosis not present

## 2023-04-09 DIAGNOSIS — Z85820 Personal history of malignant melanoma of skin: Secondary | ICD-10-CM | POA: Diagnosis not present

## 2023-04-09 DIAGNOSIS — L812 Freckles: Secondary | ICD-10-CM | POA: Diagnosis not present

## 2023-04-09 DIAGNOSIS — Z85828 Personal history of other malignant neoplasm of skin: Secondary | ICD-10-CM | POA: Diagnosis not present

## 2023-04-09 DIAGNOSIS — L821 Other seborrheic keratosis: Secondary | ICD-10-CM | POA: Diagnosis not present

## 2023-04-09 DIAGNOSIS — D2262 Melanocytic nevi of left upper limb, including shoulder: Secondary | ICD-10-CM | POA: Diagnosis not present

## 2023-04-09 DIAGNOSIS — D1801 Hemangioma of skin and subcutaneous tissue: Secondary | ICD-10-CM | POA: Diagnosis not present

## 2023-04-09 DIAGNOSIS — L218 Other seborrheic dermatitis: Secondary | ICD-10-CM | POA: Diagnosis not present

## 2023-04-09 DIAGNOSIS — D2272 Melanocytic nevi of left lower limb, including hip: Secondary | ICD-10-CM | POA: Diagnosis not present

## 2023-06-23 ENCOUNTER — Other Ambulatory Visit: Payer: Self-pay | Admitting: Internal Medicine

## 2023-06-23 DIAGNOSIS — Z1231 Encounter for screening mammogram for malignant neoplasm of breast: Secondary | ICD-10-CM

## 2023-06-27 ENCOUNTER — Other Ambulatory Visit: Payer: Self-pay | Admitting: Internal Medicine

## 2023-06-27 DIAGNOSIS — R928 Other abnormal and inconclusive findings on diagnostic imaging of breast: Secondary | ICD-10-CM

## 2023-07-25 ENCOUNTER — Ambulatory Visit
Admission: RE | Admit: 2023-07-25 | Discharge: 2023-07-25 | Disposition: A | Discharge status: Home / Self Care | Payer: Commercial Managed Care - PPO | Source: Ambulatory Visit | Attending: Internal Medicine | Admitting: Internal Medicine

## 2023-07-25 DIAGNOSIS — Z1231 Encounter for screening mammogram for malignant neoplasm of breast: Secondary | ICD-10-CM | POA: Diagnosis not present

## 2023-09-18 ENCOUNTER — Other Ambulatory Visit: Payer: Self-pay

## 2023-09-18 MED ORDER — FLULAVAL 0.5 ML IM SUSY
0.5000 mL | PREFILLED_SYRINGE | Freq: Once | INTRAMUSCULAR | 0 refills | Status: AC
  Filled 2023-09-18: qty 0.5, 1d supply, fill #0

## 2023-10-14 ENCOUNTER — Other Ambulatory Visit (HOSPITAL_COMMUNITY): Payer: Self-pay

## 2023-10-14 DIAGNOSIS — Z85828 Personal history of other malignant neoplasm of skin: Secondary | ICD-10-CM | POA: Diagnosis not present

## 2023-10-14 DIAGNOSIS — L853 Xerosis cutis: Secondary | ICD-10-CM | POA: Diagnosis not present

## 2023-10-14 DIAGNOSIS — D225 Melanocytic nevi of trunk: Secondary | ICD-10-CM | POA: Diagnosis not present

## 2023-10-14 DIAGNOSIS — D2271 Melanocytic nevi of right lower limb, including hip: Secondary | ICD-10-CM | POA: Diagnosis not present

## 2023-10-14 DIAGNOSIS — L812 Freckles: Secondary | ICD-10-CM | POA: Diagnosis not present

## 2023-10-14 DIAGNOSIS — D2272 Melanocytic nevi of left lower limb, including hip: Secondary | ICD-10-CM | POA: Diagnosis not present

## 2023-10-14 DIAGNOSIS — L821 Other seborrheic keratosis: Secondary | ICD-10-CM | POA: Diagnosis not present

## 2023-10-14 DIAGNOSIS — D2262 Melanocytic nevi of left upper limb, including shoulder: Secondary | ICD-10-CM | POA: Diagnosis not present

## 2023-10-14 DIAGNOSIS — D2261 Melanocytic nevi of right upper limb, including shoulder: Secondary | ICD-10-CM | POA: Diagnosis not present

## 2023-10-14 DIAGNOSIS — L218 Other seborrheic dermatitis: Secondary | ICD-10-CM | POA: Diagnosis not present

## 2023-10-14 MED ORDER — FLUOCINONIDE 0.05 % EX SOLN
1.0000 | Freq: Every day | CUTANEOUS | 6 refills | Status: AC | PRN
  Filled 2023-10-14: qty 60, 30d supply, fill #0

## 2023-10-16 ENCOUNTER — Other Ambulatory Visit (HOSPITAL_COMMUNITY): Payer: Self-pay

## 2023-11-21 DIAGNOSIS — H52223 Regular astigmatism, bilateral: Secondary | ICD-10-CM | POA: Diagnosis not present

## 2023-11-21 DIAGNOSIS — H524 Presbyopia: Secondary | ICD-10-CM | POA: Diagnosis not present

## 2024-01-12 DIAGNOSIS — Z01419 Encounter for gynecological examination (general) (routine) without abnormal findings: Secondary | ICD-10-CM | POA: Diagnosis not present

## 2024-01-12 DIAGNOSIS — Z01411 Encounter for gynecological examination (general) (routine) with abnormal findings: Secondary | ICD-10-CM | POA: Diagnosis not present

## 2024-01-12 DIAGNOSIS — Z113 Encounter for screening for infections with a predominantly sexual mode of transmission: Secondary | ICD-10-CM | POA: Diagnosis not present

## 2024-01-12 DIAGNOSIS — Z1331 Encounter for screening for depression: Secondary | ICD-10-CM | POA: Diagnosis not present

## 2024-01-12 DIAGNOSIS — Z124 Encounter for screening for malignant neoplasm of cervix: Secondary | ICD-10-CM | POA: Diagnosis not present

## 2024-01-13 ENCOUNTER — Other Ambulatory Visit: Payer: Self-pay | Admitting: Obstetrics and Gynecology

## 2024-01-13 DIAGNOSIS — M810 Age-related osteoporosis without current pathological fracture: Secondary | ICD-10-CM

## 2024-02-06 DIAGNOSIS — E7800 Pure hypercholesterolemia, unspecified: Secondary | ICD-10-CM | POA: Diagnosis not present

## 2024-02-09 DIAGNOSIS — Z1212 Encounter for screening for malignant neoplasm of rectum: Secondary | ICD-10-CM | POA: Diagnosis not present

## 2024-02-09 DIAGNOSIS — Z1389 Encounter for screening for other disorder: Secondary | ICD-10-CM | POA: Diagnosis not present

## 2024-02-09 DIAGNOSIS — M810 Age-related osteoporosis without current pathological fracture: Secondary | ICD-10-CM | POA: Diagnosis not present

## 2024-02-18 DIAGNOSIS — K635 Polyp of colon: Secondary | ICD-10-CM | POA: Diagnosis not present

## 2024-02-18 DIAGNOSIS — C439 Malignant melanoma of skin, unspecified: Secondary | ICD-10-CM | POA: Diagnosis not present

## 2024-02-18 DIAGNOSIS — M722 Plantar fascial fibromatosis: Secondary | ICD-10-CM | POA: Diagnosis not present

## 2024-02-18 DIAGNOSIS — M810 Age-related osteoporosis without current pathological fracture: Secondary | ICD-10-CM | POA: Diagnosis not present

## 2024-02-18 DIAGNOSIS — E7800 Pure hypercholesterolemia, unspecified: Secondary | ICD-10-CM | POA: Diagnosis not present

## 2024-02-18 DIAGNOSIS — Z1331 Encounter for screening for depression: Secondary | ICD-10-CM | POA: Diagnosis not present

## 2024-02-18 DIAGNOSIS — Z1339 Encounter for screening examination for other mental health and behavioral disorders: Secondary | ICD-10-CM | POA: Diagnosis not present

## 2024-02-18 DIAGNOSIS — Z0000 Encounter for general adult medical examination without abnormal findings: Secondary | ICD-10-CM | POA: Diagnosis not present

## 2024-04-14 DIAGNOSIS — Z85828 Personal history of other malignant neoplasm of skin: Secondary | ICD-10-CM | POA: Diagnosis not present

## 2024-04-14 DIAGNOSIS — L821 Other seborrheic keratosis: Secondary | ICD-10-CM | POA: Diagnosis not present

## 2024-04-14 DIAGNOSIS — D2261 Melanocytic nevi of right upper limb, including shoulder: Secondary | ICD-10-CM | POA: Diagnosis not present

## 2024-04-14 DIAGNOSIS — D225 Melanocytic nevi of trunk: Secondary | ICD-10-CM | POA: Diagnosis not present

## 2024-04-14 DIAGNOSIS — L820 Inflamed seborrheic keratosis: Secondary | ICD-10-CM | POA: Diagnosis not present

## 2024-04-14 DIAGNOSIS — Z85820 Personal history of malignant melanoma of skin: Secondary | ICD-10-CM | POA: Diagnosis not present

## 2024-04-14 DIAGNOSIS — L812 Freckles: Secondary | ICD-10-CM | POA: Diagnosis not present

## 2024-04-14 DIAGNOSIS — D1801 Hemangioma of skin and subcutaneous tissue: Secondary | ICD-10-CM | POA: Diagnosis not present

## 2024-08-25 ENCOUNTER — Other Ambulatory Visit: Payer: Self-pay | Admitting: Internal Medicine

## 2024-08-25 DIAGNOSIS — Z1231 Encounter for screening mammogram for malignant neoplasm of breast: Secondary | ICD-10-CM

## 2024-09-08 ENCOUNTER — Other Ambulatory Visit: Payer: Commercial Managed Care - PPO

## 2024-09-10 ENCOUNTER — Ambulatory Visit
Admission: RE | Admit: 2024-09-10 | Discharge: 2024-09-10 | Disposition: A | Discharge status: Home / Self Care | Source: Ambulatory Visit | Attending: Internal Medicine | Admitting: Internal Medicine

## 2024-09-10 ENCOUNTER — Other Ambulatory Visit (HOSPITAL_COMMUNITY): Payer: Self-pay

## 2024-09-10 DIAGNOSIS — Z1231 Encounter for screening mammogram for malignant neoplasm of breast: Secondary | ICD-10-CM | POA: Diagnosis not present

## 2024-09-10 MED ORDER — FLUZONE 0.5 ML IM SUSY
0.5000 mL | PREFILLED_SYRINGE | Freq: Once | INTRAMUSCULAR | 0 refills | Status: AC
  Filled 2024-09-10: qty 0.5, 1d supply, fill #0

## 2024-10-18 ENCOUNTER — Other Ambulatory Visit (HOSPITAL_COMMUNITY): Payer: Self-pay

## 2024-10-18 ENCOUNTER — Other Ambulatory Visit: Payer: Self-pay

## 2024-10-18 DIAGNOSIS — Z85820 Personal history of malignant melanoma of skin: Secondary | ICD-10-CM | POA: Diagnosis not present

## 2024-10-18 DIAGNOSIS — L821 Other seborrheic keratosis: Secondary | ICD-10-CM | POA: Diagnosis not present

## 2024-10-18 DIAGNOSIS — D1801 Hemangioma of skin and subcutaneous tissue: Secondary | ICD-10-CM | POA: Diagnosis not present

## 2024-10-18 DIAGNOSIS — L309 Dermatitis, unspecified: Secondary | ICD-10-CM | POA: Diagnosis not present

## 2024-10-18 DIAGNOSIS — L812 Freckles: Secondary | ICD-10-CM | POA: Diagnosis not present

## 2024-10-18 DIAGNOSIS — L218 Other seborrheic dermatitis: Secondary | ICD-10-CM | POA: Diagnosis not present

## 2024-10-18 DIAGNOSIS — L820 Inflamed seborrheic keratosis: Secondary | ICD-10-CM | POA: Diagnosis not present

## 2024-10-18 DIAGNOSIS — Z85828 Personal history of other malignant neoplasm of skin: Secondary | ICD-10-CM | POA: Diagnosis not present

## 2024-10-18 MED ORDER — FLUOCINONIDE 0.05 % EX SOLN
CUTANEOUS | 6 refills | Status: AC
  Filled 2024-10-18: qty 60, 30d supply, fill #0

## 2024-10-18 MED ORDER — TRIAMCINOLONE ACETONIDE 0.1 % EX CREA
TOPICAL_CREAM | CUTANEOUS | 2 refills | Status: AC
  Filled 2024-10-18: qty 80, 30d supply, fill #0

## 2024-10-21 ENCOUNTER — Other Ambulatory Visit: Payer: Self-pay

## 2024-12-29 ENCOUNTER — Other Ambulatory Visit (HOSPITAL_BASED_OUTPATIENT_CLINIC_OR_DEPARTMENT_OTHER): Payer: Self-pay | Admitting: Obstetrics

## 2024-12-29 DIAGNOSIS — M810 Age-related osteoporosis without current pathological fracture: Secondary | ICD-10-CM
